# Patient Record
Sex: Female | Born: 1975
Health system: Southern US, Community
[De-identification: ages and names within clinical notes are randomized; demographics above are authoritative.]

## PROBLEM LIST (undated history)

## (undated) DIAGNOSIS — R011 Cardiac murmur, unspecified: Secondary | ICD-10-CM

## (undated) DIAGNOSIS — K219 Gastro-esophageal reflux disease without esophagitis: Secondary | ICD-10-CM

## (undated) DIAGNOSIS — I10 Essential (primary) hypertension: Secondary | ICD-10-CM

## (undated) DIAGNOSIS — N939 Abnormal uterine and vaginal bleeding, unspecified: Secondary | ICD-10-CM

## (undated) DIAGNOSIS — Z973 Presence of spectacles and contact lenses: Secondary | ICD-10-CM

## (undated) DIAGNOSIS — F329 Major depressive disorder, single episode, unspecified: Secondary | ICD-10-CM

## (undated) DIAGNOSIS — F32A Depression, unspecified: Secondary | ICD-10-CM

## (undated) DIAGNOSIS — E119 Type 2 diabetes mellitus without complications: Secondary | ICD-10-CM

## (undated) DIAGNOSIS — E669 Obesity, unspecified: Secondary | ICD-10-CM

## (undated) DIAGNOSIS — R002 Palpitations: Secondary | ICD-10-CM

## (undated) DIAGNOSIS — F419 Anxiety disorder, unspecified: Secondary | ICD-10-CM

## (undated) HISTORY — DX: Obesity, unspecified: E66.9

## (undated) HISTORY — DX: Gastro-esophageal reflux disease without esophagitis: K21.9

## (undated) HISTORY — PX: TUBAL LIGATION: SHX77

## (undated) HISTORY — PX: ABDOMINAL HYSTERECTOMY: SHX81

---

## 2014-04-07 LAB — HM PAP SMEAR

## 2014-12-20 ENCOUNTER — Encounter (HOSPITAL_COMMUNITY): Payer: Self-pay | Admitting: Emergency Medicine

## 2014-12-20 ENCOUNTER — Emergency Department (INDEPENDENT_AMBULATORY_CARE_PROVIDER_SITE_OTHER)
Admission: EM | Admit: 2014-12-20 | Discharge: 2014-12-20 | Disposition: A | Discharge status: Home / Self Care | Payer: Managed Care, Other (non HMO) | Source: Home / Self Care | Attending: Family Medicine | Admitting: Family Medicine

## 2014-12-20 DIAGNOSIS — K2100 Gastro-esophageal reflux disease with esophagitis, without bleeding: Secondary | ICD-10-CM

## 2014-12-20 HISTORY — DX: Essential (primary) hypertension: I10

## 2014-12-20 HISTORY — DX: Major depressive disorder, single episode, unspecified: F32.9

## 2014-12-20 HISTORY — DX: Anxiety disorder, unspecified: F41.9

## 2014-12-20 HISTORY — DX: Depression, unspecified: F32.A

## 2014-12-20 MED ORDER — GI COCKTAIL ~~LOC~~
30.0000 mL | Freq: Once | ORAL | Status: AC
  Administered 2014-12-20: 30 mL via ORAL

## 2014-12-20 MED ORDER — GI COCKTAIL ~~LOC~~
ORAL | Status: AC
  Filled 2014-12-20: qty 30

## 2014-12-20 MED ORDER — SUCRALFATE 1 GM/10ML PO SUSP: 1.0000 g | Freq: Three times a day (TID) | ORAL | Status: DC

## 2014-12-20 NOTE — ED Provider Notes (Signed)
CSN: [id number]     Arrival date & time 12/20/14  1833 History   First MD Initiated Contact with Patient 12/20/14 1858     Chief Complaint  Patient presents with  . Gastrophageal Reflux   (Consider location/radiation/quality/duration/timing/severity/associated sxs/prior Treatment) Patient is a 39 y.o. female presenting with GERD. The history is provided by the patient.  Gastrophageal Reflux This is a chronic problem. The current episode started more than 1 week ago (has to drive 1 hr qam to work, here to [employer]  for family issues, stressed.). The problem occurs daily. The problem has been gradually worsening. Associated symptoms include chest pain and abdominal pain.    Past Medical History  Diagnosis Date  . Anxiety   . Hypertension   . Depression    Past Surgical History  Procedure Laterality Date  . Cesarean section     No family history on file. Social History  Substance Use Topics  . Smoking status: Never Smoker   . Smokeless tobacco: None  . Alcohol Use: Yes   OB History    No data available     Review of Systems  Respiratory: Negative.   Cardiovascular: Positive for chest pain.  Gastrointestinal: Positive for abdominal pain. Negative for nausea, vomiting, diarrhea and abdominal distention.  Genitourinary: Negative.     Allergies  Review of patient's allergies indicates no known allergies.  Home Medications   Prior to Admission medications   Medication Sig Start Date End Date Taking? Authorizing Provider  amLODipine (NORVASC) 2.5 MG tablet Take 2.5 mg by mouth daily.   Yes Historical Provider, MD  BIOTIN PO Take by mouth.   Yes Historical Provider, MD  Cholecalciferol (VITAMIN D3) 3000 UNITS TABS Take by mouth.   Yes Historical Provider, MD  CINNAMON PO Take by mouth.   Yes Historical Provider, MD  escitalopram (LEXAPRO) 10 MG tablet Take 10 mg by mouth daily.   Yes Historical Provider, MD  esomeprazole (NEXIUM) 20 MG capsule Take 20 mg by mouth daily at 12  noon.   Yes Historical Provider, MD  Fish Oil OIL by Does not apply route.   Yes Historical Provider, MD  Magnesium Oxide 400 MG CAPS Take by mouth.   Yes Historical Provider, MD  metoprolol tartrate (LOPRESSOR) 25 MG tablet Take 25 mg by mouth 2 (two) times daily.   Yes Historical Provider, MD  sucralfate (CARAFATE) 1 GM/10ML suspension Take 10 mLs (1 g total) by mouth 4 (four) times daily -  with meals and at bedtime. 12/20/14   Billy Fischer, MD   Meds Ordered and Administered this Visit   Medications  gi cocktail (Maalox,Lidocaine,Donnatal) (30 mLs Oral Given 12/20/14 1938)    BP 134/78 mmHg  Pulse 87  Temp(Src) 97.6 F (36.4 C) (Oral)  Resp 17  SpO2 100%  LMP 12/03/2014 No data found.   Physical Exam  Constitutional: She is oriented to person, place, and time. She appears well-developed and well-nourished. No distress.  Cardiovascular: Regular rhythm, normal heart sounds and intact distal pulses.   Pulmonary/Chest: Effort normal and breath sounds normal.  Abdominal: Soft. Bowel sounds are normal. She exhibits no distension and no mass. There is no tenderness. There is no rebound and no guarding.  Neurological: She is alert and oriented to person, place, and time.  Skin: Skin is warm and dry.  Nursing note and vitals reviewed.   ED Course  Procedures (including critical care time)  Labs Review Labs Reviewed - No data to display  Imaging Review  No results found.   Visual Acuity Review  Right Eye Distance:   Left Eye Distance:   Bilateral Distance:    Right Eye Near:   Left Eye Near:    Bilateral Near:         MDM   1. Gastroesophageal reflux disease with esophagitis        Billy Fischer, MD 12/20/14 [phone]

## 2014-12-20 NOTE — ED Notes (Signed)
Patient new to the area and has no pcp.  Has history of reflux.  Abdominal upset after eating sweets and cold drinks, pain into left mid back.  Increase in belching .

## 2014-12-20 NOTE — Discharge Instructions (Signed)
Use medicine as prescribed and call to see dr stark when appt avail for further stomach care.

## 2014-12-21 ENCOUNTER — Encounter: Payer: Self-pay | Admitting: Gastroenterology

## 2015-02-05 ENCOUNTER — Ambulatory Visit (INDEPENDENT_AMBULATORY_CARE_PROVIDER_SITE_OTHER): Payer: 59 | Admitting: Gastroenterology

## 2015-02-05 ENCOUNTER — Encounter: Payer: Self-pay | Admitting: Gastroenterology

## 2015-02-05 VITALS — BP 136/90 | HR 80 | Ht 69.0 in | Wt 348.5 lb

## 2015-02-05 DIAGNOSIS — M546 Pain in thoracic spine: Secondary | ICD-10-CM | POA: Diagnosis not present

## 2015-02-05 DIAGNOSIS — K219 Gastro-esophageal reflux disease without esophagitis: Secondary | ICD-10-CM | POA: Diagnosis not present

## 2015-02-05 NOTE — Progress Notes (Signed)
    History of Present Illness: This is a 39 year old female referred by Ihor Gully, MD for the evaluation of GERD and back pain. She relates a 6-7 year history of GERD. She notes about a 100 pound weight gain over the past 5 years. Her weight has recently leveled off. Her reflux symptoms have been particularly bothersome for the past few years. Initially treated with Prilosec OTC and now treated with Nexium OTC. She notes frequent breakthrough reflux symptoms and occasional nocturnal symptoms. Over the past 2 months she has had mild pain in her thoracic back just left of midline when swallowing that she describes the pain as 1 out of 10. It starts within a few seconds of swallowing and passes within several minutes. She notes Carafate and Nexium at 40 mg daily have not helped her back pain but her heartburn is better controlled. She noted relief of the symptoms with a GI cocktail when seen at urgent care. Denies weight loss, abdominal pain, constipation, diarrhea, change in stool caliber, melena, hematochezia, nausea, vomiting, dysphagia, chest pain.   Review of Systems: Pertinent positive and negative review of systems were noted in the above HPI section. All other review of systems were otherwise negative.  Current Medications, Allergies, Past Medical History, Past Surgical History, Family History and Social History were reviewed in [employer] record.  Physical Exam: General: Well developed, well nourished, obese, no acute distress Head: Normocephalic and atraumatic Eyes:  sclerae anicteric, EOMI Ears: Normal auditory acuity Mouth: No deformity or lesions Neck: Supple, no masses or thyromegaly Lungs: Clear throughout to auscultation Heart: Regular rate and rhythm; no murmurs, rubs or bruits Abdomen: Soft, non tender and non distended. No masses, hepatosplenomegaly or hernias noted. Normal Bowel sounds Musculoskeletal: Symmetrical with no gross deformities  Skin: No  lesions on visible extremities Pulses:  Normal pulses noted Extremities: No clubbing, cyanosis, edema or deformities noted Neurological: Alert oriented x 4, grossly nonfocal Cervical Nodes:  No significant cervical adenopathy Inguinal Nodes: No significant inguinal adenopathy Psychological:  Alert and cooperative. Normal mood and affect  Assessment and Recommendations:  1. GERD. Back pain related to swallowing. Symptoms suggest an esophageal etiology. Continue Nexium 40 mg daily, Carafate as needed. TUMS or Maalox as needed. Rule out esophagitis and other disorders. Schedule EGD. The risks (including bleeding, perforation, infection, missed lesions, medication reactions and possible hospitalization or surgery if complications occur), benefits, and alternatives to endoscopy with possible biopsy and possible dilation were discussed with the patient and they consent to proceed.    cc: Ihor Gully, MD

## 2015-02-05 NOTE — Patient Instructions (Signed)
You have been scheduled for an endoscopy. Please follow written instructions given to you at your visit today. If you use inhalers (even only as needed), please bring them with you on the day of your procedure.  Patient advised to avoid spicy, acidic, citrus, chocolate, mints, fruit and fruit juices.  Limit the intake of caffeine, alcohol and Soda.  Don't exercise too soon after eating.  Don't lie down within 3-4 hours of eating.  Elevate the head of your bed.  Thank you for choosing me and [facility].  Pricilla Riffle. Dagoberto Ligas., MD., Marval Regal

## 2015-02-08 ENCOUNTER — Telehealth: Payer: Self-pay

## 2015-02-08 ENCOUNTER — Other Ambulatory Visit: Payer: Self-pay

## 2015-02-08 DIAGNOSIS — K219 Gastro-esophageal reflux disease without esophagitis: Secondary | ICD-10-CM

## 2015-02-08 NOTE — Telephone Encounter (Signed)
Error

## 2015-02-08 NOTE — Telephone Encounter (Signed)
Rescheduled patient's EGD for 03/14/15 at 8:30am. Pt notified and verbalized understanding.

## 2015-02-08 NOTE — Telephone Encounter (Signed)
-----   Message from Ladene Artist, MD sent at 02/08/2015 11:23 AM EDT ----- Moderate in the morning is better for me  ----- Message -----    From: Marzella Schlein, CMA    Sent: 02/08/2015  11:05 AM      To: Ladene Artist, MD  So I rescheduled her for your hospital week on 12/7. You Tuesday on 11/29 is full. The only time they could get was 1:30pm if you want propofol. We can do moderate in the morning. Is the 1:30pm ok? ----- Message -----    From: Ladene Artist, MD    Sent: 02/08/2015   8:43 AM      To: Marzella Schlein, CMA  [facility] notified me that her BMI is 51 so cannot perform EGD in [place]

## 2015-02-12 ENCOUNTER — Encounter: Payer: 59 | Admitting: Gastroenterology

## 2015-02-15 ENCOUNTER — Ambulatory Visit: Payer: Managed Care, Other (non HMO) | Admitting: Gastroenterology

## 2015-03-09 ENCOUNTER — Encounter: Payer: Self-pay | Admitting: Internal Medicine

## 2015-03-09 ENCOUNTER — Ambulatory Visit (INDEPENDENT_AMBULATORY_CARE_PROVIDER_SITE_OTHER): Payer: 59 | Admitting: Internal Medicine

## 2015-03-09 VITALS — BP 118/70 | HR 71 | Temp 98.5°F | Resp 20 | Ht 70.0 in | Wt 350.5 lb

## 2015-03-09 DIAGNOSIS — E559 Vitamin D deficiency, unspecified: Secondary | ICD-10-CM

## 2015-03-09 DIAGNOSIS — K219 Gastro-esophageal reflux disease without esophagitis: Secondary | ICD-10-CM | POA: Insufficient documentation

## 2015-03-09 DIAGNOSIS — E669 Obesity, unspecified: Secondary | ICD-10-CM | POA: Insufficient documentation

## 2015-03-09 DIAGNOSIS — I10 Essential (primary) hypertension: Secondary | ICD-10-CM | POA: Diagnosis not present

## 2015-03-09 DIAGNOSIS — E1159 Type 2 diabetes mellitus with other circulatory complications: Secondary | ICD-10-CM | POA: Insufficient documentation

## 2015-03-09 DIAGNOSIS — F419 Anxiety disorder, unspecified: Secondary | ICD-10-CM | POA: Diagnosis not present

## 2015-03-09 MED ORDER — AMLODIPINE BESYLATE 2.5 MG PO TABS: 2.5000 mg | ORAL_TABLET | Freq: Every day | ORAL | Status: DC

## 2015-03-09 MED ORDER — ESCITALOPRAM OXALATE 10 MG PO TABS: 10.0000 mg | ORAL_TABLET | Freq: Every day | ORAL | Status: DC

## 2015-03-09 MED ORDER — SUCRALFATE 1 GM/10ML PO SUSP: 1.0000 g | Freq: Three times a day (TID) | ORAL | Status: DC

## 2015-03-09 MED ORDER — METOPROLOL TARTRATE 25 MG PO TABS: 25.0000 mg | ORAL_TABLET | Freq: Two times a day (BID) | ORAL | Status: DC

## 2015-03-09 NOTE — Assessment & Plan Note (Signed)
She is interested in weight loss Declined nutrition referral Encouraged regular exercise Discussed the importance of decreasing her portions and decreasing intake overall. Healthy diet and encouraged She plans on joining [employer]

## 2015-03-09 NOTE — Assessment & Plan Note (Signed)
Well-controlled here today Continue current medications Check CMP

## 2015-03-09 NOTE — Assessment & Plan Note (Signed)
Following with GI Recently uncontrolled, but now controlled with addition of Carafate EGD scheduled Continue current medications

## 2015-03-09 NOTE — Assessment & Plan Note (Signed)
Taking vitamin D daily 

## 2015-03-09 NOTE — Progress Notes (Signed)
Subjective:    Patient ID: Debra Le, female    DOB: [birth year], 39 y.o.   MRN: [id number]  HPI She is here to establish with a new pcp.   Hypertension: She is taking her medication daily. She is compliant with a low sodium diet.  She denies chest pain, palpitations, edema, shortness of breath and regular headaches. She is exercising regularly.  She does not monitor her blood pressure at home.    GERD:  She is taking her medication daily as prescribed.  She denies any GERD symptoms and feels her GERD is well controlled. Recently she was experiencing uncontrolled heartburn and saw a gastroenterologist. She was placed on Carafate in addition to her Nexium. Her symptoms are well controlled now, but she will be having an EGD to evaluate further.  Right ear:  She states it feels like something is in it.  She denies any pain. A doctor where she works looked in it and saw a scab and advised using a q-tip with neosporin and it has helped.  It feels like there is wax in there.  No change in hearing or itching.    She is interested in a weight loss or nutrition program.  She started celexa six years ago and gained a lot of weight.    Anxiety: she has anxiety that can cause some depression.  She is now taking lexapro daily and it works well.    Medications and allergies reviewed with patient and updated.  Patient Active Problem List   Diagnosis Date Noted  . Essential hypertension, benign 03/09/2015  . GERD (gastroesophageal reflux disease) 03/09/2015  . Anxiety 03/09/2015  . Vitamin D deficiency 03/09/2015    Current Outpatient Prescriptions on File Prior to Visit  Medication Sig Dispense Refill  . BIOTIN PO Take by mouth.    . Cholecalciferol (VITAMIN D3) 3000 UNITS TABS Take by mouth.    Marland Kitchen CINNAMON PO Take by mouth.    . esomeprazole (NEXIUM) 20 MG capsule Take 40 mg by mouth daily at 12 noon.     . Fish Oil OIL by Does not apply route.    . Magnesium Oxide 400 MG CAPS Take by  mouth.     No current facility-administered medications on file prior to visit.    Past Medical History  Diagnosis Date  . Anxiety   . Hypertension   . Depression   . GERD (gastroesophageal reflux disease)   . Obesity     Past Surgical History  Procedure Laterality Date  . Cesarean section  02/21/2005    with Tubal ligation    Social History   Social History  . Marital Status: Married    Spouse Name: N/A  . Number of Children: 2  . Years of Education: N/A   Occupational History  . RN    Social History Main Topics  . Smoking status: Never Smoker   . Smokeless tobacco: Never Used  . Alcohol Use: 4.2 oz/week    7 Standard drinks or equivalent per week     Comment: red wine at night  . Drug Use: No  . Sexual Activity: Not Asked   Other Topics Concern  . None   Social History Narrative    Review of Systems  Constitutional: Negative for fever, chills and fatigue.  HENT: Positive for congestion (occasional). Negative for ear pain, sinus pressure and sore throat.   Eyes: Negative for visual disturbance.  Respiratory: Negative for cough, shortness of breath and wheezing.  Cardiovascular: Negative for chest pain, palpitations and leg swelling.  Gastrointestinal: Negative for nausea, abdominal pain, diarrhea, constipation and blood in stool.  Endocrine: Negative for polydipsia and polyuria.  Genitourinary: Negative for dysuria and hematuria.  Musculoskeletal: Negative for myalgias, back pain and arthralgias.  Neurological: Positive for headaches (menstrual). Negative for dizziness, weakness, light-headedness and numbness.  Psychiatric/Behavioral: Positive for dysphoric mood. The patient is nervous/anxious.        Objective:   Filed Vitals:   03/09/15 1321  BP: 118/70  Pulse: 71  Temp: 98.5 F (36.9 C)  Resp: 20   Filed Weights   03/09/15 1321  Weight: 350 lb 8 oz (158.986 kg)   Body mass index is 50.29 kg/(m^2).   Physical Exam  Constitutional: She  appears well-developed and well-nourished.  HENT:  Head: Normocephalic and atraumatic.  Right Ear: External ear normal.  Left Ear: External ear normal.  Normal ear canals and TM  Eyes: Conjunctivae are normal.  Neck: Neck supple. No tracheal deviation present. No thyromegaly present.  Cardiovascular: Normal rate, regular rhythm and normal heart sounds.   No murmur heard. Pulmonary/Chest: Effort normal. No respiratory distress. She has no wheezes. She has no rales.  Abdominal: Soft. She exhibits no distension. There is no tenderness.  Musculoskeletal: She exhibits no edema.  Lymphadenopathy:    She has no cervical adenopathy.  Skin: Skin is warm and dry.  Psychiatric: She has a normal mood and affect. Her behavior is normal.          Assessment & Plan:   See Problem List.   Blood work ordered  Follow up in 6 months

## 2015-03-09 NOTE — Progress Notes (Signed)
Pre visit review using our clinic review tool, if applicable. No additional management support is needed unless otherwise documented below in the visit note. 

## 2015-03-09 NOTE — Assessment & Plan Note (Signed)
Controlled, stable Continue Lexapro 10 mg daily

## 2015-03-09 NOTE — Patient Instructions (Addendum)
  We have reviewed your prior records including labs and tests today.  Test(s) ordered today. Your results will be released to [place] (or called to you) after review, usually within 72hours after test completion. If any changes need to be made, you will be notified at that same time.  All other Health Maintenance issues reviewed.   All recommended immunizations and age-appropriate screenings are up-to-date.  No immunizations administered today.   Medications reviewed and updated.  No changes recommended at this time.  Your prescription(s) have been submitted to your pharmacy. Please take as directed and contact our office if you believe you are having problem(s) with the medication(s).  Please schedule followup in 6 months  [place], [place] [address] from [facility] [place], [place]

## 2015-03-14 ENCOUNTER — Ambulatory Visit (HOSPITAL_COMMUNITY): Admission: RE | Admit: 2015-03-14 | Payer: 59 | Source: Ambulatory Visit | Admitting: Gastroenterology

## 2015-03-14 ENCOUNTER — Telehealth: Payer: Self-pay | Admitting: Gastroenterology

## 2015-03-14 ENCOUNTER — Encounter (HOSPITAL_COMMUNITY): Admission: RE | Payer: Self-pay | Source: Ambulatory Visit

## 2015-03-14 SURGERY — ESOPHAGOGASTRODUODENOSCOPY (EGD) WITH PROPOFOL
Anesthesia: Monitor Anesthesia Care

## 2015-03-14 NOTE — Telephone Encounter (Signed)
I spoke with Linna Hoff and Sharee Pimple at [facility] endo. They do not know why the case was cancelled. Sharee Pimple cancelled it on 11/29 "scheduling error", but she doesn't know why.  I spoke with the patient she will look at her work schedule and call me back.  She will look about the week of 04/30/15

## 2015-04-25 ENCOUNTER — Encounter: Payer: Self-pay | Admitting: Internal Medicine

## 2015-04-25 ENCOUNTER — Ambulatory Visit (INDEPENDENT_AMBULATORY_CARE_PROVIDER_SITE_OTHER): Payer: 59 | Admitting: Internal Medicine

## 2015-04-25 VITALS — BP 148/92 | HR 86 | Temp 98.4°F | Resp 18 | Wt 352.0 lb

## 2015-04-25 DIAGNOSIS — J069 Acute upper respiratory infection, unspecified: Secondary | ICD-10-CM

## 2015-04-25 MED ORDER — HYDROCOD POLST-CPM POLST ER 10-8 MG/5ML PO SUER: 5.0000 mL | Freq: Two times a day (BID) | ORAL | Status: DC | PRN

## 2015-04-25 NOTE — Progress Notes (Signed)
Subjective:    Patient ID: Debra Le, female    DOB: 1975/08/19, 40 y.o.   MRN: [id number]  HPI She is here for an acute visit for cold symptoms.  Her symptoms started three nights ago.  She has nasal congestion, sore throat, ear pain, sneezing, cough, headaches and chills. She denies any sick contacts. She is taking Alka-Seltzer cold, severe cold and sinus medication, Mucinex and Vicks.      Medications and allergies reviewed with patient and updated if appropriate.  Patient Active Problem List   Diagnosis Date Noted  . Essential hypertension, benign 03/09/2015  . GERD (gastroesophageal reflux disease) 03/09/2015  . Anxiety 03/09/2015  . Vitamin D deficiency 03/09/2015  . Obesity 03/09/2015    Current Outpatient Prescriptions on File Prior to Visit  Medication Sig Dispense Refill  . amLODipine (NORVASC) 2.5 MG tablet Take 1 tablet (2.5 mg total) by mouth daily. 30 tablet 5  . BIOTIN PO Take by mouth.    . Cholecalciferol (VITAMIN D3) 3000 UNITS TABS Take by mouth.    Marland Kitchen CINNAMON PO Take by mouth.    . escitalopram (LEXAPRO) 10 MG tablet Take 1 tablet (10 mg total) by mouth daily. 30 tablet 5  . esomeprazole (NEXIUM) 20 MG capsule Take 40 mg by mouth daily at 12 noon.     . Fish Oil OIL by Does not apply route.    . Magnesium Oxide 400 MG CAPS Take by mouth.    . metoprolol tartrate (LOPRESSOR) 25 MG tablet Take 1 tablet (25 mg total) by mouth 2 (two) times daily. 60 tablet 5  . sucralfate (CARAFATE) 1 GM/10ML suspension Take 10 mLs (1 g total) by mouth 4 (four) times daily -  with meals and at bedtime. 420 mL 3   No current facility-administered medications on file prior to visit.    Past Medical History  Diagnosis Date  . Anxiety   . Hypertension   . Depression   . GERD (gastroesophageal reflux disease)   . Obesity     Past Surgical History  Procedure Laterality Date  . Cesarean section  02/21/2005    with Tubal ligation    Social History   Social  History  . Marital Status: Married    Spouse Name: N/A  . Number of Children: 2  . Years of Education: N/A   Occupational History  . RN    Social History Main Topics  . Smoking status: Never Smoker   . Smokeless tobacco: Never Used  . Alcohol Use: 4.2 oz/week    7 Standard drinks or equivalent per week     Comment: red wine at night  . Drug Use: No  . Sexual Activity: Not Asked   Other Topics Concern  . 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 Diabetes Mother   . Diabetes Maternal Aunt     all maternal side  . Diabetes Sister     x 2  . Hypertension Maternal Aunt     all maternal side  . Lymphoma Father   . Heart attack Maternal Aunt   . Dementia Maternal Aunt     Review of Systems  Constitutional: Positive for chills. Negative for fever.  HENT: Positive for congestion, ear pain (right), sneezing and sore throat. Negative for sinus pressure.   Respiratory: Positive for cough (dry). Negative for shortness of  breath and wheezing.   Cardiovascular: Negative for chest pain.  Gastrointestinal: Negative for nausea, abdominal pain and diarrhea.  Musculoskeletal:       Mild body aches  Neurological: Positive for headaches. Negative for dizziness and light-headedness.       Objective:   Filed Vitals:   04/25/15 1000  BP: 148/92  Pulse: 86  Temp: 98.4 F (36.9 C)  Resp: 18   Filed Weights   04/25/15 1000  Weight: 352 lb (159.666 kg)   Body mass index is 50.51 kg/(m^2).   Physical Exam GENERAL APPEARANCE: Appears stated age, mildly ill appearing, NAD EYES: conjunctiva clear, no icterus HEENT: bilateral tympanic membranes and ear canals normal, oropharynx with mild erythema, no thyromegaly, trachea midline, no cervical or supraclavicular lymphadenopathy LUNGS: Clear to auscultation without wheeze or crackles, unlabored breathing, good air entry bilaterally HEART: Normal S1,S2  without murmurs EXTREMITIES: Without clubbing, cyanosis, or edema        Assessment & Plan:   URI Viral in nature No need for an antibiotic Will prescribe cough syrup with hydrocodone Discussed over-the-counter cold medications for symptom relief Rest and fluids Call if no improvement  Note given to excuse her from work

## 2015-04-25 NOTE — Patient Instructions (Addendum)
A prescription for cough syrup was given.  Start flonase.    If your symptoms worsen or fail to improve, please contact our office for further instruction, or in case of emergency go directly to the emergency room at the closest medical facility.   General Recommendations:    Please drink plenty of fluids.  Get plenty of rest   Sleep in humidified air  Use saline nasal sprays  Netti pot  OTC Medications:  Decongestants - helps relieve congestion   Flonase (generic fluticasone) or Nasacort (generic triamcinolone) - please make sure to use the "cross-over" technique at a 45 degree angle towards the opposite eye as opposed to straight up the nasal passageway.   Sudafed (generic pseudoephedrine - Note this is the one that is available behind the pharmacy counter); Products with phenylephrine (-PE) may also be used but is often not as effective as pseudoephedrine.   If you have HIGH BLOOD PRESSURE - Coricidin HBP; AVOID any product that is -D as this contains pseudoephedrine which may increase your blood pressure.  Afrin (oxymetazoline) every 6-8 hours for up to 3 days.  Allergies - helps relieve runny nose, itchy eyes and sneezing   Claritin (generic loratidine), Allegra (fexofenidine), or Zyrtec (generic cyrterizine) for runny nose. These medications should not cause drowsiness.  Note - Benadryl (generic diphenhydramine) may be used however may cause drowsiness  Cough -   Delsym or Robitussin (generic dextromethorphan)  Expectorants - helps loosen mucus to ease removal   Mucinex (generic guaifenesin) as directed on the package.  Headaches / General Aches   Tylenol (generic acetaminophen) - DO NOT EXCEED 3 grams (3,000 mg) in a 24 hour time period  Advil/Motrin (generic ibuprofen)  Sore Throat -   Salt water gargle   Chloraseptic (generic benzocaine) spray or lozenges / Sucrets (generic dyclonine)

## 2015-04-25 NOTE — Progress Notes (Signed)
Pre visit review using our clinic review tool, if applicable. No additional management support is needed unless otherwise documented below in the visit note. 

## 2015-07-04 ENCOUNTER — Other Ambulatory Visit: Payer: Self-pay | Admitting: Internal Medicine

## 2015-07-11 ENCOUNTER — Other Ambulatory Visit: Payer: Self-pay | Admitting: *Deleted

## 2015-07-11 MED ORDER — AMLODIPINE BESYLATE 2.5 MG PO TABS: 2.5000 mg | ORAL_TABLET | Freq: Every day | ORAL | Status: DC

## 2015-07-11 MED ORDER — METOPROLOL TARTRATE 25 MG PO TABS: 25.0000 mg | ORAL_TABLET | Freq: Two times a day (BID) | ORAL | Status: DC

## 2015-07-11 MED ORDER — ESCITALOPRAM OXALATE 10 MG PO TABS: 10.0000 mg | ORAL_TABLET | Freq: Every day | ORAL | Status: DC

## 2015-07-11 NOTE — Telephone Encounter (Signed)
Received call pt states she is needing refills on her medications. Not schedule to see md until June. Verified which med she is needing & pharmacy inform will send to [facility].../lmb

## 2015-08-27 ENCOUNTER — Telehealth: Payer: Self-pay | Admitting: Internal Medicine

## 2015-08-27 NOTE — Telephone Encounter (Signed)
Error

## 2015-09-11 ENCOUNTER — Ambulatory Visit: Payer: 59 | Admitting: Internal Medicine

## 2015-09-19 ENCOUNTER — Encounter: Payer: Self-pay | Admitting: Internal Medicine

## 2015-09-19 ENCOUNTER — Ambulatory Visit (INDEPENDENT_AMBULATORY_CARE_PROVIDER_SITE_OTHER): Payer: 59 | Admitting: Internal Medicine

## 2015-09-19 ENCOUNTER — Other Ambulatory Visit (INDEPENDENT_AMBULATORY_CARE_PROVIDER_SITE_OTHER): Payer: 59

## 2015-09-19 VITALS — BP 130/86 | HR 69 | Temp 98.7°F | Resp 16 | Ht 70.0 in | Wt 351.0 lb

## 2015-09-19 DIAGNOSIS — E669 Obesity, unspecified: Secondary | ICD-10-CM | POA: Diagnosis not present

## 2015-09-19 DIAGNOSIS — K219 Gastro-esophageal reflux disease without esophagitis: Secondary | ICD-10-CM

## 2015-09-19 DIAGNOSIS — E559 Vitamin D deficiency, unspecified: Secondary | ICD-10-CM | POA: Diagnosis not present

## 2015-09-19 DIAGNOSIS — R229 Localized swelling, mass and lump, unspecified: Secondary | ICD-10-CM

## 2015-09-19 DIAGNOSIS — L910 Hypertrophic scar: Secondary | ICD-10-CM | POA: Insufficient documentation

## 2015-09-19 DIAGNOSIS — I10 Essential (primary) hypertension: Secondary | ICD-10-CM

## 2015-09-19 DIAGNOSIS — F419 Anxiety disorder, unspecified: Secondary | ICD-10-CM

## 2015-09-19 DIAGNOSIS — G43809 Other migraine, not intractable, without status migrainosus: Secondary | ICD-10-CM

## 2015-09-19 DIAGNOSIS — G43909 Migraine, unspecified, not intractable, without status migrainosus: Secondary | ICD-10-CM | POA: Insufficient documentation

## 2015-09-19 LAB — COMPREHENSIVE METABOLIC PANEL
ALBUMIN: 3.8 g/dL (ref 3.5–5.2)
ALK PHOS: 104 U/L (ref 39–117)
ALT: 15 U/L (ref 0–35)
AST: 13 U/L (ref 0–37)
BUN: 11 mg/dL (ref 6–23)
CO2: 29 mEq/L (ref 19–32)
Calcium: 8.8 mg/dL (ref 8.4–10.5)
Chloride: 104 mEq/L (ref 96–112)
Creatinine, Ser: 0.82 mg/dL (ref 0.40–1.20)
GFR: 99.58 mL/min (ref >60.00)
Glucose, Bld: 124 mg/dL — ABNORMAL HIGH (ref 70–99)
POTASSIUM: 4.2 meq/L (ref 3.5–5.1)
SODIUM: 138 meq/L (ref 135–145)
TOTAL PROTEIN: 7.5 g/dL (ref 6.0–8.3)
Total Bilirubin: 0.3 mg/dL (ref 0.2–1.2)

## 2015-09-19 LAB — CBC WITH DIFFERENTIAL/PLATELET
Basophils Absolute: 0 10*3/uL (ref 0.0–0.1)
Basophils Relative: 0.5 % (ref 0.0–3.0)
EOS PCT: 0.8 % (ref 0.0–5.0)
Eosinophils Absolute: 0.1 10*3/uL (ref 0.0–0.7)
HCT: 33.7 % — ABNORMAL LOW (ref 36.0–46.0)
HEMOGLOBIN: 11 g/dL — AB (ref 12.0–15.0)
LYMPHS PCT: 46.9 % — AB (ref 12.0–46.0)
Lymphs Abs: 3.3 10*3/uL (ref 0.7–4.0)
MCHC: 32.6 g/dL (ref 30.0–36.0)
MCV: 84.4 fl (ref 78.0–100.0)
MONOS PCT: 5 % (ref 3.0–12.0)
Monocytes Absolute: 0.4 10*3/uL (ref 0.1–1.0)
Neutro Abs: 3.3 10*3/uL (ref 1.4–7.7)
Neutrophils Relative %: 46.8 % (ref 43.0–77.0)
Platelets: 361 10*3/uL (ref 150.0–400.0)
RBC: 4 Mil/uL (ref 3.87–5.11)
RDW: 15 % (ref 11.5–15.5)
WBC: 7.1 10*3/uL (ref 4.0–10.5)

## 2015-09-19 LAB — LIPID PANEL
Cholesterol: 182 mg/dL (ref 0–200)
HDL: 27.3 mg/dL — AB (ref >39.00)
LDL Cholesterol: 130 mg/dL — ABNORMAL HIGH (ref 0–99)
NONHDL: 155.19
Total CHOL/HDL Ratio: 7
Triglycerides: 128 mg/dL (ref 0.0–149.0)
VLDL: 25.6 mg/dL (ref 0.0–40.0)

## 2015-09-19 LAB — MAGNESIUM: Magnesium: 2 mg/dL (ref 1.5–2.5)

## 2015-09-19 LAB — TSH: TSH: 1.81 u[IU]/mL (ref 0.35–4.50)

## 2015-09-19 LAB — HEMOGLOBIN A1C: HEMOGLOBIN A1C: 7.4 % — AB (ref 4.6–6.5)

## 2015-09-19 LAB — VITAMIN B12: VITAMIN B 12: 647 pg/mL (ref 211–911)

## 2015-09-19 MED ORDER — TRAMADOL HCL 50 MG PO TABS: 50.0000 mg | ORAL_TABLET | Freq: Three times a day (TID) | ORAL | Status: DC | PRN

## 2015-09-19 MED ORDER — SUMATRIPTAN SUCCINATE 100 MG PO TABS: 100.0000 mg | ORAL_TABLET | ORAL | Status: DC | PRN

## 2015-09-19 NOTE — Assessment & Plan Note (Signed)
In abdomen along c-section scar Tender for a few months, severe pain with her menses this past month Does not feel like a cyst or lipoma Possible hernia Will check an [place] She will schedule with her gyn Tramadol as needed for pain

## 2015-09-19 NOTE — Progress Notes (Signed)
Subjective:    Patient ID: Debra Le, female    DOB: 1975-08-28, 40 y.o.   MRN: [id number]  HPI She is here for follow up.  Hypertension: She is taking her medication daily. She is compliant with a low sodium diet.  She denies chest pain, palpitations, edema, shortness of breath and regular headaches. She is not exercising regularly.  She does not monitor her blood pressure at home.    GERD:  She is taking her medication daily as prescribed.  She denies any GERD symptoms and feels her GERD is well controlled.   Anxiety: She is taking her medication daily as prescribed. She denies any side effects from the medication. She feels her anxiety is well controlled and she is happy with her current dose of medication.   Headaches:  For the past two years when she gets her period she gets a pounding headache when she is sleeping.  They used to last one night and then go away.  This month it lasted about 4 days.  She has tried midol and advil and they haven't worked in the past, but this past time did not seem to help.  This past time she did have some photosensitivity and nausea.  She wonders if there is something else she can take.  C-section about 10 years ago.  She has had a little lump near the scar for a while.  The lump is getting bigger.  It has ached three for a few months.  This past month it has been more painful when she had her period only. The pain was severe.  She has taken aleve, motrin and has used a heating pad.  She had to take a norco it hurt so bad.  It still hurts.  Today it is better.  She last saw her gyn about one year ago.    She had a piercing in her right ear and developed a large scar over. She would like to see someone to have it removed.  Medications and allergies reviewed with patient and updated if appropriate.  Patient Active Problem List   Diagnosis Date Noted  . Essential hypertension, benign 03/09/2015  . GERD (gastroesophageal reflux disease) 03/09/2015  .  Anxiety 03/09/2015  . Vitamin D deficiency 03/09/2015  . Obesity 03/09/2015    Current Outpatient Prescriptions on File Prior to Visit  Medication Sig Dispense Refill  . amLODipine (NORVASC) 2.5 MG tablet Take 1 tablet (2.5 mg total) by mouth daily. Keep June appt for future refills 90 tablet 0  . BIOTIN PO Take by mouth.    . Cholecalciferol (VITAMIN D3) 3000 UNITS TABS Take by mouth.    Marland Kitchen CINNAMON PO Take by mouth.    . escitalopram (LEXAPRO) 10 MG tablet Take 1 tablet (10 mg total) by mouth daily. Keep June appt for future refills 90 tablet 0  . esomeprazole (NEXIUM) 20 MG capsule Take 40 mg by mouth daily at 12 noon.     . Fish Oil OIL by Does not apply route.    . metoprolol tartrate (LOPRESSOR) 25 MG tablet Take 1 tablet (25 mg total) by mouth 2 (two) times daily. 180 tablet 0   No current facility-administered medications on file prior to visit.    Past Medical History  Diagnosis Date  . Anxiety   . Hypertension   . Depression   . GERD (gastroesophageal reflux disease)   . Obesity     Past Surgical History  Procedure Laterality Date  .  Cesarean section  02/21/2005    with Tubal ligation    Social History   Social History  . Marital Status: Married    Spouse Name: N/A  . Number of Children: 2  . Years of Education: N/A   Occupational History  . RN    Social History Main Topics  . Smoking status: Never Smoker   . Smokeless tobacco: Never Used  . Alcohol Use: 4.2 oz/week    7 Standard drinks or equivalent per week     Comment: red wine at night  . Drug Use: No  . Sexual Activity: Not on file   Other Topics Concern  . 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 Diabetes Mother   . Diabetes Maternal Aunt     all maternal side  . Diabetes Sister     x 2  . Hypertension Maternal Aunt     all maternal side  . Lymphoma Father   . Heart attack Maternal Aunt   .  Dementia Maternal Aunt     Review of Systems  Constitutional: Negative for fever.  Respiratory: Negative for cough, shortness of breath and wheezing.   Cardiovascular: Negative for chest pain, palpitations and leg swelling.  Gastrointestinal: Positive for abdominal pain.       No gerd  Neurological: Positive for headaches (migraines). Negative for dizziness and light-headedness.       Objective:   Filed Vitals:   09/19/15 0945  BP: 130/86  Pulse: 69  Temp: 98.7 F (37.1 C)  Resp: 16   Filed Weights   09/19/15 0945  Weight: 351 lb (159.213 kg)   Body mass index is 50.36 kg/(m^2).   Physical Exam Constitutional: Appears well-developed and well-nourished. No distress.  Neck: Neck supple. No tracheal deviation present. No thyromegaly present.  No carotid bruit. No cervical adenopathy.   large keloid scar posterior upper right ear Cardiovascular: Normal rate, regular rhythm and normal heart sounds.   No murmur heard.  No edema Pulmonary/Chest: Effort normal and breath sounds normal. No respiratory distress. No wheezes.  Abdomen: Obese, soft, C-section scar across lower abdomen-in the midsection there is a firm bump that is slightly tender-does not feel like a typical lipoma or cyst, no surrounding erythema, no open wound Psych: normal mood and affect      Assessment & Plan:    See Problem List for Assessment and Plan of chronic medical problems.  Follow-up in 6 months

## 2015-09-19 NOTE — Assessment & Plan Note (Signed)
GERD controlled Continue daily medication  

## 2015-09-19 NOTE — Assessment & Plan Note (Signed)
BP well controlled Current regimen effective and well tolerated Continue current medications at current doses Check labs Work on weight loss

## 2015-09-19 NOTE — Assessment & Plan Note (Signed)
Controlled, stable Continue current dose of medication  

## 2015-09-19 NOTE — Assessment & Plan Note (Signed)
Work on increasing exercise Check a1c

## 2015-09-19 NOTE — Assessment & Plan Note (Signed)
Advil, midol not helping any more Trial of imitrex If the imitrex is not effective she will let me know and we can try something else

## 2015-09-19 NOTE — Progress Notes (Signed)
Pre visit review using our clinic review tool, if applicable. No additional management support is needed unless otherwise documented below in the visit note. 

## 2015-09-19 NOTE — Assessment & Plan Note (Signed)
Right ear from piercing We'll refer to plastic surgery for removal

## 2015-09-19 NOTE — Patient Instructions (Addendum)
[facility] - Dr Zoe Lan [facility] [address] [place], [place] [address] from [place], [place]   Test(s) ordered today. Your results will be released to [facility] (or called to you) after review, usually within 72hours after test completion. If any changes need to be made, you will be notified at that same time.  All other Health Maintenance issues reviewed.   All recommended immunizations and age-appropriate screenings are up-to-date or discussed.  No immunizations administered today.   Medications reviewed and updated.  Changes include a trial of imitrex for your migraines.  If this does not work let me know and we will try something different.   A referral was ordered for plastic surgery.  An [place] of your abdomen was ordered  Please followup in 6 months

## 2015-09-20 ENCOUNTER — Encounter: Payer: Self-pay | Admitting: Internal Medicine

## 2015-09-20 DIAGNOSIS — E1169 Type 2 diabetes mellitus with other specified complication: Secondary | ICD-10-CM | POA: Insufficient documentation

## 2015-09-20 DIAGNOSIS — E119 Type 2 diabetes mellitus without complications: Secondary | ICD-10-CM | POA: Insufficient documentation

## 2015-10-04 ENCOUNTER — Other Ambulatory Visit: Payer: Self-pay | Admitting: Internal Medicine

## 2015-10-04 ENCOUNTER — Ambulatory Visit
Admission: RE | Admit: 2015-10-04 | Discharge: 2015-10-04 | Disposition: A | Discharge status: Home / Self Care | Payer: 59 | Source: Ambulatory Visit | Attending: Internal Medicine | Admitting: Internal Medicine

## 2015-10-04 DIAGNOSIS — R229 Localized swelling, mass and lump, unspecified: Secondary | ICD-10-CM

## 2015-10-15 ENCOUNTER — Other Ambulatory Visit: Payer: Self-pay | Admitting: Internal Medicine

## 2015-11-27 ENCOUNTER — Telehealth: Payer: Self-pay | Admitting: Emergency Medicine

## 2015-11-27 MED ORDER — BUPROPION HCL ER (XL) 150 MG PO TB24: 150.0000 mg | ORAL_TABLET | Freq: Every day | ORAL | 1 refills | Status: DC

## 2015-11-27 NOTE — Telephone Encounter (Signed)
Spoke with pt to inform.  

## 2015-11-27 NOTE — Addendum Note (Signed)
Addended by: Binnie Rail on: 11/27/2015 12:24 PM   Modules accepted: Orders

## 2015-11-27 NOTE — Telephone Encounter (Signed)
Pt states the Lexapro makes her "numb". Would like to if she can still have Wellbutrin 100-150 mg added with the lexapro. Please advise.

## 2015-11-27 NOTE — Telephone Encounter (Signed)
Pt asked for you to call her back about medications. Wouldn't give any other details. Please advise thanks.

## 2015-11-27 NOTE — Telephone Encounter (Signed)
Yes, we can add wellbutrin 150 mg daily to the lexapro.  [facility]butrin [facility].

## 2015-12-28 ENCOUNTER — Encounter: Payer: 59 | Admitting: Internal Medicine

## 2015-12-28 NOTE — Progress Notes (Signed)
Subjective:    Patient ID: Debra Le, female    DOB: 1975-08-02, 40 y.o.   MRN: [id number]  HPI error   Medications and allergies reviewed with patient and updated if appropriate.  Patient Active Problem List   Diagnosis Date Noted  . Diabetes ([place]) 09/20/2015  . Keloid 09/19/2015  . Lump of skin 09/19/2015  . Migraines 09/19/2015  . Essential hypertension, benign 03/09/2015  . GERD (gastroesophageal reflux disease) 03/09/2015  . Anxiety 03/09/2015  . Vitamin D deficiency 03/09/2015  . Obesity 03/09/2015    Current Outpatient Prescriptions on File Prior to Visit  Medication Sig Dispense Refill  . amLODipine (NORVASC) 2.5 MG tablet Take 1 tablet (2.5 mg total) by mouth daily. 90 tablet 2  . BIOTIN PO Take by mouth.    Marland Kitchen buPROPion (WELLBUTRIN XL) 150 MG 24 hr tablet Take 1 tablet (150 mg total) by mouth daily. 90 tablet 1  . Cholecalciferol (VITAMIN D3) 3000 UNITS TABS Take by mouth.    Marland Kitchen CINNAMON PO Take by mouth.    . escitalopram (LEXAPRO) 10 MG tablet Take 1 tablet (10 mg total) by mouth daily. 90 tablet 0  . esomeprazole (NEXIUM) 20 MG capsule Take 40 mg by mouth daily at 12 noon.     . Fish Oil OIL by Does not apply route.    . metoprolol tartrate (LOPRESSOR) 25 MG tablet TAKE 1 TABLET (25 MG TOTAL) BY MOUTH 2 (TWO) TIMES DAILY. 180 tablet 2  . SUMAtriptan (IMITREX) 100 MG tablet Take 1 tablet (100 mg total) by mouth every 2 (two) hours as needed for migraine. May repeat in 2 hours if headache persists or recurs. 10 tablet 5  . traMADol (ULTRAM) 50 MG tablet Take 1 tablet (50 mg total) by mouth every 8 (eight) hours as needed. 30 tablet 0   No current facility-administered medications on file prior to visit.     Past Medical History:  Diagnosis Date  . Anxiety   . Depression   . GERD (gastroesophageal reflux disease)   . Hypertension   . Obesity     Past Surgical History:  Procedure Laterality Date  . CESAREAN SECTION  02/21/2005   with Tubal ligation      Social History   Social History  . Marital status: Married    Spouse name: N/A  . Number of children: 2  . Years of education: N/A   Occupational History  . RN    Social History Main Topics  . Smoking status: Never Smoker  . Smokeless tobacco: Never Used  . Alcohol use 4.2 oz/week    7 Standard drinks or equivalent per week     Comment: red wine at night  . Drug use: No  . Sexual activity: Not on file   Other Topics Concern  . 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 Diabetes Mother   . Diabetes Maternal Aunt     all maternal side  . Diabetes Sister     x 2  . Hypertension Maternal Aunt     all maternal side  . Lymphoma Father   . Heart attack Maternal Aunt   . Dementia Maternal Aunt     Review of Systems     Objective:  There were no vitals filed for this visit. There were no vitals filed for this visit. There  is no height or weight on file to calculate BMI.   Physical Exam         Assessment & Plan:    See Problem List for Assessment and Plan of chronic medical problems.    This encounter was created in error - please disregard.

## 2015-12-28 NOTE — Assessment & Plan Note (Signed)
Resulting in diabetes, htn Stressed lifestyle - regular exercise, decreased portions and weight loss

## 2016-01-27 ENCOUNTER — Other Ambulatory Visit: Payer: Self-pay | Admitting: Internal Medicine

## 2016-03-21 ENCOUNTER — Ambulatory Visit: Payer: 59 | Admitting: Internal Medicine

## 2016-04-16 ENCOUNTER — Other Ambulatory Visit: Payer: Self-pay | Admitting: Internal Medicine

## 2016-04-17 ENCOUNTER — Other Ambulatory Visit: Payer: Self-pay | Admitting: Internal Medicine

## 2016-05-21 ENCOUNTER — Ambulatory Visit: Payer: 59 | Admitting: Internal Medicine

## 2016-06-12 ENCOUNTER — Encounter: Payer: Self-pay | Admitting: Internal Medicine

## 2016-06-13 ENCOUNTER — Ambulatory Visit (INDEPENDENT_AMBULATORY_CARE_PROVIDER_SITE_OTHER): Payer: 59 | Admitting: Internal Medicine

## 2016-06-13 ENCOUNTER — Encounter: Payer: Self-pay | Admitting: Internal Medicine

## 2016-06-13 ENCOUNTER — Other Ambulatory Visit (INDEPENDENT_AMBULATORY_CARE_PROVIDER_SITE_OTHER): Payer: 59

## 2016-06-13 VITALS — BP 130/94 | HR 71 | Temp 98.0°F | Resp 16 | Ht 70.0 in | Wt 345.0 lb

## 2016-06-13 DIAGNOSIS — N809 Endometriosis, unspecified: Secondary | ICD-10-CM | POA: Insufficient documentation

## 2016-06-13 DIAGNOSIS — F329 Major depressive disorder, single episode, unspecified: Secondary | ICD-10-CM | POA: Insufficient documentation

## 2016-06-13 DIAGNOSIS — K219 Gastro-esophageal reflux disease without esophagitis: Secondary | ICD-10-CM | POA: Diagnosis not present

## 2016-06-13 DIAGNOSIS — I10 Essential (primary) hypertension: Secondary | ICD-10-CM | POA: Diagnosis not present

## 2016-06-13 DIAGNOSIS — E119 Type 2 diabetes mellitus without complications: Secondary | ICD-10-CM

## 2016-06-13 DIAGNOSIS — F419 Anxiety disorder, unspecified: Secondary | ICD-10-CM

## 2016-06-13 DIAGNOSIS — G43809 Other migraine, not intractable, without status migrainosus: Secondary | ICD-10-CM | POA: Diagnosis not present

## 2016-06-13 DIAGNOSIS — E669 Obesity, unspecified: Secondary | ICD-10-CM | POA: Diagnosis not present

## 2016-06-13 DIAGNOSIS — F32A Depression, unspecified: Secondary | ICD-10-CM

## 2016-06-13 LAB — MICROALBUMIN / CREATININE URINE RATIO
Creatinine,U: 156.8 mg/dL
Microalb Creat Ratio: 1 mg/g (ref 0.0–30.0)
Microalb, Ur: 1.6 mg/dL (ref 0.0–1.9)

## 2016-06-13 LAB — TSH: TSH: 2.09 u[IU]/mL (ref 0.35–4.50)

## 2016-06-13 LAB — CBC WITH DIFFERENTIAL/PLATELET
BASOS PCT: 1.4 % (ref 0.0–3.0)
Basophils Absolute: 0.1 10*3/uL (ref 0.0–0.1)
EOS PCT: 0.8 % (ref 0.0–5.0)
Eosinophils Absolute: 0.1 10*3/uL (ref 0.0–0.7)
HCT: 33.8 % — ABNORMAL LOW (ref 36.0–46.0)
Hemoglobin: 11 g/dL — ABNORMAL LOW (ref 12.0–15.0)
LYMPHS ABS: 3.2 10*3/uL (ref 0.7–4.0)
Lymphocytes Relative: 36.5 % (ref 12.0–46.0)
MCHC: 32.5 g/dL (ref 30.0–36.0)
MCV: 84.5 fl (ref 78.0–100.0)
Monocytes Absolute: 0.3 10*3/uL (ref 0.1–1.0)
Monocytes Relative: 3.9 % (ref 3.0–12.0)
NEUTROS PCT: 57.4 % (ref 43.0–77.0)
Neutro Abs: 5.1 10*3/uL (ref 1.4–7.7)
Platelets: 400 10*3/uL (ref 150.0–400.0)
RBC: 4 Mil/uL (ref 3.87–5.11)
RDW: 15.2 % (ref 11.5–15.5)
WBC: 8.9 10*3/uL (ref 4.0–10.5)

## 2016-06-13 LAB — COMPREHENSIVE METABOLIC PANEL
ALBUMIN: 3.9 g/dL (ref 3.5–5.2)
ALK PHOS: 112 U/L (ref 39–117)
ALT: 20 U/L (ref 0–35)
AST: 17 U/L (ref 0–37)
BILIRUBIN TOTAL: 0.2 mg/dL (ref 0.2–1.2)
BUN: 8 mg/dL (ref 6–23)
CO2: 26 mEq/L (ref 19–32)
CREATININE: 0.85 mg/dL (ref 0.40–1.20)
Calcium: 9.4 mg/dL (ref 8.4–10.5)
Chloride: 104 mEq/L (ref 96–112)
GFR: 95.18 mL/min (ref >60.00)
Glucose, Bld: 159 mg/dL — ABNORMAL HIGH (ref 70–99)
Potassium: 4.1 mEq/L (ref 3.5–5.1)
SODIUM: 136 meq/L (ref 135–145)
TOTAL PROTEIN: 8 g/dL (ref 6.0–8.3)

## 2016-06-13 LAB — HEMOGLOBIN A1C: HEMOGLOBIN A1C: 7.7 % — AB (ref 4.6–6.5)

## 2016-06-13 MED ORDER — AMLODIPINE BESYLATE 5 MG PO TABS: 5.0000 mg | ORAL_TABLET | Freq: Every day | ORAL | 3 refills | Status: DC

## 2016-06-13 MED ORDER — ESOMEPRAZOLE MAGNESIUM 40 MG PO CPDR: 40.0000 mg | DELAYED_RELEASE_CAPSULE | Freq: Every day | ORAL | 3 refills | Status: DC

## 2016-06-13 MED ORDER — METOPROLOL TARTRATE 25 MG PO TABS: ORAL_TABLET | ORAL | 3 refills | Status: DC

## 2016-06-13 MED ORDER — ESCITALOPRAM OXALATE 10 MG PO TABS: 10.0000 mg | ORAL_TABLET | Freq: Every day | ORAL | 1 refills | Status: DC

## 2016-06-13 MED ORDER — BUPROPION HCL ER (XL) 300 MG PO TB24: 300.0000 mg | ORAL_TABLET | Freq: Every day | ORAL | 3 refills | Status: DC

## 2016-06-13 NOTE — Assessment & Plan Note (Addendum)
Elevated here and at gyn's office Increase amlodipine to 5 mg daily Check cmp

## 2016-06-13 NOTE — Patient Instructions (Addendum)
  Test(s) ordered today. Your results will be released to [place] (or called to you) after review, usually within 72hours after test completion. If any changes need to be made, you will be notified at that same time.  All other Health Maintenance issues reviewed.   All recommended immunizations and age-appropriate screenings are up-to-date or discussed.  No immunizations administered today.   Medications reviewed and updated.  Changes include increasing wellbutrin to 300 mg daily and increasing amlodipine to 5 mg daily.   Your prescription(s) have been submitted to your pharmacy. Please take as directed and contact our office if you believe you are having problem(s) with the medication(s).   Please followup in 6 months

## 2016-06-13 NOTE — Assessment & Plan Note (Addendum)
Uncontrolled Discussed option Increase wellbutrin to 300 mg Continue lexapro 10 mg daily

## 2016-06-13 NOTE — Progress Notes (Signed)
Pre visit review using our clinic review tool, if applicable. No additional management support is needed unless otherwise documented below in the visit note. 

## 2016-06-13 NOTE — Progress Notes (Signed)
Subjective:    Patient ID: Debra Le, female    DOB: 10/24/1975, 41 y.o.   MRN: [id number]  HPI The patient is here for follow up.  Hypertension: She is taking her medication daily. She is compliant with a low sodium diet.  She denies chest pain, palpitations, edema, shortness of breath and regular headaches. She is walking regularly.    Diabetes: She is not on any medication for her diabetes. She is compliant with a diabetic diet. She is exercising regularly - walking.  She checks her feet daily and denies foot lesions. She is up-to-date with an ophthalmology examination.   GERD:  She is taking her medication daily as prescribed.  She denies any GERD symptoms and feels her GERD is well controlled.   Anxiety: She is taking her medication daily as prescribed. She denies any side effects from the medication. She feels her anxiety is well controlled.     Depression: She is taking her medication daily as prescribed. She denies any side effects from the medication. She feels her depression is not as well controlled.  She wonders about changing her medication.      Migraines:  She is taking imitrex as needed.     Medications and allergies reviewed with patient and updated if appropriate.  Patient Active Problem List   Diagnosis Date Noted  . Diabetes ([place]) 09/20/2015  . Keloid 09/19/2015  . Lump of skin 09/19/2015  . Migraines 09/19/2015  . Essential hypertension, benign 03/09/2015  . GERD (gastroesophageal reflux disease) 03/09/2015  . Anxiety 03/09/2015  . Vitamin D deficiency 03/09/2015  . Morbid obesity ([place]) 03/09/2015    Current Outpatient Prescriptions on File Prior to Visit  Medication Sig Dispense Refill  . amLODipine (NORVASC) 2.5 MG tablet Take 1 tablet (2.5 mg total) by mouth daily. 90 tablet 2  . BIOTIN PO Take by mouth.    Marland Kitchen buPROPion (WELLBUTRIN XL) 150 MG 24 hr tablet Take 1 tablet (150 mg total) by mouth daily. 90 tablet 1  . Cholecalciferol (VITAMIN D3)  3000 UNITS TABS Take by mouth.    Marland Kitchen CINNAMON PO Take by mouth.    . escitalopram (LEXAPRO) 10 MG tablet TAKE 1 TABLET (10 MG TOTAL) BY MOUTH DAILY. 90 tablet 0  . esomeprazole (NEXIUM) 20 MG capsule Take 40 mg by mouth daily at 12 noon.     . Fish Oil OIL by Does not apply route.    . metoprolol tartrate (LOPRESSOR) 25 MG tablet TAKE 1 TABLET (25 MG TOTAL) BY MOUTH 2 (TWO) TIMES DAILY. 180 tablet 2  . SUMAtriptan (IMITREX) 100 MG tablet Take 1 tablet (100 mg total) by mouth every 2 (two) hours as needed for migraine. May repeat in 2 hours if headache persists or recurs. 10 tablet 5  . traMADol (ULTRAM) 50 MG tablet Take 1 tablet (50 mg total) by mouth every 8 (eight) hours as needed. 30 tablet 0   No current facility-administered medications on file prior to visit.     Past Medical History:  Diagnosis Date  . Anxiety   . Depression   . GERD (gastroesophageal reflux disease)   . Hypertension   . Obesity     Past Surgical History:  Procedure Laterality Date  . CESAREAN SECTION  02/21/2005   with Tubal ligation    Social History   Social History  . Marital status: Married    Spouse name: N/A  . Number of children: 2  . Years of education: N/A  Occupational History  . RN    Social History Main Topics  . Smoking status: Never Smoker  . Smokeless tobacco: Never Used  . Alcohol use 4.2 oz/week    7 Standard drinks or equivalent per week     Comment: red wine at night  . Drug use: No  . Sexual activity: Not Asked   Other Topics Concern  . 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 Diabetes Mother   . Diabetes Maternal Aunt     all maternal side  . Diabetes Sister     x 2  . Hypertension Maternal Aunt     all maternal side  . Lymphoma Father   . Heart attack Maternal Aunt   . Dementia Maternal Aunt     Review of Systems  Constitutional: Negative for chills and fever.    Respiratory: Negative for cough, shortness of breath and wheezing.   Cardiovascular: Negative for chest pain, palpitations and leg swelling.  Gastrointestinal:       Jerrye Bushy controlled  Neurological: Negative for dizziness, light-headedness and headaches.       Objective:   Vitals:   06/13/16 0929  BP: (!) 130/94  Pulse: 71  Resp: 16  Temp: 98 F (36.7 C)   Wt Readings from Last 3 Encounters:  06/13/16 (!) 345 lb (156.5 kg)  09/19/15 (!) 351 lb (159.2 kg)  04/25/15 (!) 352 lb (159.7 kg)   Body mass index is 49.5 kg/m.   Physical Exam    Constitutional: Appears well-developed and well-nourished. No distress.  HENT:  Head: Normocephalic and atraumatic.  Neck: Neck supple. No tracheal deviation present. No thyromegaly present.  No cervical lymphadenopathy Cardiovascular: Normal rate, regular rhythm and normal heart sounds.   No murmur heard. No carotid bruit .  No edema Pulmonary/Chest: Effort normal and breath sounds normal. No respiratory distress. No has no wheezes. No rales.  Skin: Skin is warm and dry. Not diaphoretic.  Psychiatric: Normal mood and affect. Behavior is normal.      Assessment & Plan:    See Problem List for Assessment and Plan of chronic medical problems.   FU in 6 months

## 2016-06-15 ENCOUNTER — Other Ambulatory Visit: Payer: Self-pay | Admitting: Internal Medicine

## 2016-06-15 NOTE — Assessment & Plan Note (Signed)
Check a1c Continue regular exercise and work on weight loss Will likely need medication - discussed options - would like to start something that promotes weight loss as well

## 2016-06-15 NOTE — Assessment & Plan Note (Signed)
GERD controlled Continue daily medication  

## 2016-06-15 NOTE — Assessment & Plan Note (Signed)
Well controlled Continue current dose of lexapro

## 2016-06-15 NOTE — Assessment & Plan Note (Signed)
On birth control and has not had any migraines imitrex as needed

## 2016-06-16 ENCOUNTER — Other Ambulatory Visit: Payer: Self-pay | Admitting: Emergency Medicine

## 2016-06-16 MED ORDER — METFORMIN HCL 500 MG PO TABS: 500.0000 mg | ORAL_TABLET | Freq: Two times a day (BID) | ORAL | 0 refills | Status: DC

## 2016-06-16 MED ORDER — METFORMIN HCL 500 MG PO TABS: 500.0000 mg | ORAL_TABLET | Freq: Two times a day (BID) | ORAL | 1 refills | Status: DC

## 2016-06-16 NOTE — Progress Notes (Signed)
Please advise on Victoza sig. Pend and route back to me. Pt would like short supply sent to local and then 90 day to mail order. Thanks  RXs have been send.

## 2016-06-17 ENCOUNTER — Telehealth: Payer: Self-pay | Admitting: Internal Medicine

## 2016-06-17 ENCOUNTER — Telehealth: Payer: Self-pay | Admitting: *Deleted

## 2016-06-17 MED ORDER — LIRAGLUTIDE 18 MG/3ML ~~LOC~~ SOPN: PEN_INJECTOR | SUBCUTANEOUS | 1 refills | Status: DC

## 2016-06-17 MED ORDER — INSULIN PEN NEEDLE 32G X 4 MM MISC: 1 refills | Status: DC

## 2016-06-17 MED ORDER — DULAGLUTIDE 0.75 MG/0.5ML ~~LOC~~ SOAJ: 0.7500 mg | SUBCUTANEOUS | 5 refills | Status: DC

## 2016-06-17 MED ORDER — LIRAGLUTIDE 18 MG/3ML ~~LOC~~ SOPN: PEN_INJECTOR | SUBCUTANEOUS | 0 refills | Status: DC

## 2016-06-17 MED ORDER — INSULIN PEN NEEDLE 32G X 4 MM MISC: 0 refills | Status: DC

## 2016-06-17 NOTE — Telephone Encounter (Signed)
Rec'd fax stating the Victoza that was rx is not on pt formulary plan insurance will not cover. The alternatives are Bydureon pens or vials, Byetta pen, and Trulicity pen...Johny Chess

## 2016-06-17 NOTE — Telephone Encounter (Signed)
Pt called in about a med that you spoke with her about yesterday?  She wanted to know if you were able to call it in ?

## 2016-06-17 NOTE — Telephone Encounter (Signed)
Changed to trulicity - let her know  -- this is once a week injection

## 2016-06-17 NOTE — Telephone Encounter (Signed)
Spoke with pt to inform both medication have now been sent to [facility].

## 2016-06-18 NOTE — Telephone Encounter (Signed)
Called pt no answer LMOM w/med change...Johny Chess

## 2016-06-19 ENCOUNTER — Telehealth: Payer: Self-pay | Admitting: Emergency Medicine

## 2016-06-19 NOTE — Telephone Encounter (Signed)
Entered in Error

## 2016-06-20 ENCOUNTER — Telehealth: Payer: Self-pay | Admitting: Internal Medicine

## 2016-06-20 MED ORDER — DULAGLUTIDE 0.75 MG/0.5ML ~~LOC~~ SOAJ: 0.7500 mg | SUBCUTANEOUS | 1 refills | Status: DC

## 2016-06-20 NOTE — Telephone Encounter (Signed)
RX sent to Mail order

## 2016-06-20 NOTE — Telephone Encounter (Signed)
Pt called stated she pick up the first set of Trulicity at [facility] but she [facility] the [facility]. Please advise.

## 2016-06-24 ENCOUNTER — Telehealth: Payer: Self-pay | Admitting: Internal Medicine

## 2016-06-24 ENCOUNTER — Encounter (HOSPITAL_COMMUNITY): Payer: Self-pay | Admitting: Emergency Medicine

## 2016-06-24 DIAGNOSIS — Z5321 Procedure and treatment not carried out due to patient leaving prior to being seen by health care provider: Secondary | ICD-10-CM | POA: Diagnosis not present

## 2016-06-24 DIAGNOSIS — R109 Unspecified abdominal pain: Secondary | ICD-10-CM | POA: Insufficient documentation

## 2016-06-24 LAB — CBC WITH DIFFERENTIAL/PLATELET
BASOS PCT: 0 %
Basophils Absolute: 0 10*3/uL (ref 0.0–0.1)
Eosinophils Absolute: 0 10*3/uL (ref 0.0–0.7)
Eosinophils Relative: 0 %
HEMATOCRIT: 39.6 % (ref 36.0–46.0)
HEMOGLOBIN: 12.8 g/dL (ref 12.0–15.0)
Lymphocytes Relative: 23 %
Lymphs Abs: 2.3 10*3/uL (ref 0.7–4.0)
MCH: 27.4 pg (ref 26.0–34.0)
MCHC: 32.3 g/dL (ref 30.0–36.0)
MCV: 84.6 fL (ref 78.0–100.0)
MONOS PCT: 4 %
Monocytes Absolute: 0.4 10*3/uL (ref 0.1–1.0)
NEUTROS ABS: 7.1 10*3/uL (ref 1.7–7.7)
Neutrophils Relative %: 73 %
Platelets: 478 10*3/uL — ABNORMAL HIGH (ref 150–400)
RBC: 4.68 MIL/uL (ref 3.87–5.11)
RDW: 14.7 % (ref 11.5–15.5)
WBC: 9.7 10*3/uL (ref 4.0–10.5)

## 2016-06-24 LAB — COMPREHENSIVE METABOLIC PANEL
ALK PHOS: 99 U/L (ref 38–126)
ALT: 27 U/L (ref 14–54)
ANION GAP: 10 (ref 5–15)
AST: 28 U/L (ref 15–41)
Albumin: 3.4 g/dL — ABNORMAL LOW (ref 3.5–5.0)
BILIRUBIN TOTAL: 0.6 mg/dL (ref 0.3–1.2)
BUN: 8 mg/dL (ref 6–20)
CALCIUM: 9.3 mg/dL (ref 8.9–10.3)
CO2: 23 mmol/L (ref 22–32)
Chloride: 102 mmol/L (ref 101–111)
Creatinine, Ser: 0.98 mg/dL (ref 0.44–1.00)
GFR calc Af Amer: >60 mL/min (ref >60)
GLUCOSE: 130 mg/dL — AB (ref 65–99)
Potassium: 3.9 mmol/L (ref 3.5–5.1)
Sodium: 135 mmol/L (ref 135–145)
TOTAL PROTEIN: 7.8 g/dL (ref 6.5–8.1)

## 2016-06-24 LAB — URINALYSIS, ROUTINE W REFLEX MICROSCOPIC
BILIRUBIN URINE: NEGATIVE
Glucose, UA: NEGATIVE mg/dL
Hgb urine dipstick: NEGATIVE
Ketones, ur: 20 mg/dL — AB
LEUKOCYTES UA: NEGATIVE
Nitrite: NEGATIVE
PH: 5 (ref 5.0–8.0)
Protein, ur: 100 mg/dL — AB
SPECIFIC GRAVITY, URINE: 1.031 — AB (ref 1.005–1.030)

## 2016-06-24 LAB — PREGNANCY, URINE: Preg Test, Ur: NEGATIVE

## 2016-06-24 LAB — CBG MONITORING, ED: Glucose-Capillary: 138 mg/dL — ABNORMAL HIGH (ref 65–99)

## 2016-06-24 MED ORDER — BLOOD GLUCOSE MONITOR KIT: PACK | 0 refills | Status: AC

## 2016-06-24 NOTE — Telephone Encounter (Signed)
Please advise 

## 2016-06-24 NOTE — Telephone Encounter (Signed)
The 2 pills of metformin are causing severe abdominal pain and nausea.  Patient states she cannot tolerate that dose.  Wants to know if it just has to get out of her system or if there is something else she can do.

## 2016-06-24 NOTE — Telephone Encounter (Signed)
Spoke with pt, she is not able to take her blood sugars bc she does not have a kit. Kit sent to [facility]. Pt will try taking 1 metformin daily.

## 2016-06-24 NOTE — ED Triage Notes (Signed)
Pt st's she was recently started on Metformin.  St's she was only taking 1 day for a week (as prescribed) then increased it to BID yesterday (as prescrbed)  Pt st's after taking the second pill yesterday she started having abd cramping with nausea, no vomiting.  St's she can't eat anything

## 2016-06-24 NOTE — Telephone Encounter (Signed)
If she can take one metformin a day without symptoms she should take 1 -  If even one causes symptoms she can stop it.  How are her sugars?

## 2016-06-25 ENCOUNTER — Emergency Department (HOSPITAL_COMMUNITY)
Admission: EM | Admit: 2016-06-25 | Discharge: 2016-06-25 | Disposition: A | Discharge status: Home / Self Care | Payer: 59 | Attending: Emergency Medicine | Admitting: Emergency Medicine

## 2016-06-25 HISTORY — DX: Type 2 diabetes mellitus without complications: E11.9

## 2016-08-27 ENCOUNTER — Encounter: Payer: Self-pay | Admitting: Internal Medicine

## 2016-08-27 ENCOUNTER — Ambulatory Visit (INDEPENDENT_AMBULATORY_CARE_PROVIDER_SITE_OTHER): Payer: 59 | Admitting: Internal Medicine

## 2016-08-27 VITALS — BP 118/78 | HR 86 | Temp 98.1°F | Resp 16 | Wt 331.0 lb

## 2016-08-27 DIAGNOSIS — K219 Gastro-esophageal reflux disease without esophagitis: Secondary | ICD-10-CM | POA: Diagnosis not present

## 2016-08-27 DIAGNOSIS — K2289 Other specified disease of esophagus: Secondary | ICD-10-CM | POA: Insufficient documentation

## 2016-08-27 DIAGNOSIS — K228 Other specified diseases of esophagus: Secondary | ICD-10-CM | POA: Diagnosis not present

## 2016-08-27 MED ORDER — RANITIDINE HCL 150 MG PO TABS: 150.0000 mg | ORAL_TABLET | Freq: Every day | ORAL | 1 refills | Status: DC

## 2016-08-27 MED ORDER — SUCRALFATE 1 G PO TABS: 1.0000 g | ORAL_TABLET | Freq: Three times a day (TID) | ORAL | 0 refills | Status: DC

## 2016-08-27 NOTE — Patient Instructions (Signed)
Start zantac at bedtime.    Use the carafate temporarily to help heal your stomach.   Make revisions in your diet.   Continue your weight loss efforts.   Call if no improvement    Heartburn Heartburn is a type of pain or discomfort that can happen in the throat or chest. It is often described as a burning pain. It may also cause a bad taste in the mouth. Heartburn may feel worse when you lie down or bend over, and it is often worse at night. Heartburn may be caused by stomach contents that move back up into the esophagus (reflux). Follow these instructions at home: Take these actions to decrease your discomfort and to help avoid complications. Diet   Follow a diet as recommended by your health care provider. This may involve avoiding foods and drinks such as:  Coffee and tea (with or without caffeine).  Drinks that contain alcohol.  Energy drinks and sports drinks.  Carbonated drinks or sodas.  Chocolate and cocoa.  Peppermint and mint flavorings.  Garlic and onions.  Horseradish.  Spicy and acidic foods, including peppers, chili powder, curry powder, vinegar, hot sauces, and barbecue sauce.  Citrus fruit juices and citrus fruits, such as oranges, lemons, and limes.  Tomato-based foods, such as red sauce, chili, salsa, and pizza with red sauce.  Fried and fatty foods, such as donuts, french fries, potato chips, and high-fat dressings.  High-fat meats, such as hot dogs and fatty cuts of red and white meats, such as rib eye steak, sausage, ham, and bacon.  High-fat dairy items, such as whole milk, butter, and cream cheese.  Eat small, frequent meals instead of large meals.  Avoid drinking large amounts of liquid with your meals.  Avoid eating meals during the 2-3 hours before bedtime.  Avoid lying down right after you eat.  Do not exercise right after you eat. General instructions   Pay attention to any changes in your symptoms.  Take over-the-counter and  prescription medicines only as told by your health care provider. Do not take aspirin, ibuprofen, or other NSAIDs unless your health care provider told you to do so.  Do not use any tobacco products, including cigarettes, chewing tobacco, and e-cigarettes. If you need help quitting, ask your health care provider.  Wear loose-fitting clothing. Do not wear anything tight around your waist that causes pressure on your abdomen.  Raise (elevate) the head of your bed about 6 inches (15 cm).  Try to reduce your stress, such as with yoga or meditation. If you need help reducing stress, ask your health care provider.  If you are overweight, reduce your weight to an amount that is healthy for you. Ask your health care provider for guidance about a safe weight loss goal.  Keep all follow-up visits as told by your health care provider. This is important. Contact a health care provider if:  You have new symptoms.  You have unexplained weight loss.  You have difficulty swallowing, or it hurts to swallow.  You have wheezing or a persistent cough.  Your symptoms do not improve with treatment.  You have frequent heartburn for more than two weeks. Get help right away if:  You have pain in your arms, neck, jaw, teeth, or back.  You feel sweaty, dizzy, or light-headed.  You have chest pain or shortness of breath.  You vomit and your vomit looks like blood or coffee grounds.  Your stool is bloody or black. This information is not  intended to replace advice given to you by your health care provider. Make sure you discuss any questions you have with your health care provider. Document Released: 08/10/2008 Document Revised: 08/30/2015 Document Reviewed: 07/19/2014 Elsevier Interactive Patient Education  2017 [employer].

## 2016-08-27 NOTE — Assessment & Plan Note (Signed)
Not ideally controlled Start zantac at night Continue nexium daily carafate - short term use only - it did help, but the carafate she has at home is expired - will prescribe this due to epigastric pain and concern for possible gastritis or early ulcer Revise diet - information given Call if no improvement

## 2016-08-27 NOTE — Progress Notes (Signed)
Subjective:    Patient ID: Debra Le, female    DOB: Mar 11, 1976, 41 y.o.   MRN: [id number]  HPI She is here for an acute visit.   ? Ulcer or indigestion:  It started three weeks ago.  She has intermittent pain in her epigastric region that feels like it goes to her back.  Certain foods have caused the pain, such as lasagna and carbonated drinks.  She has had increased GERD and burping.  She is taking her nexium daily and previously would get GERD 1-2 times a month.  She denies changes in her diet.  She does have increased stress.  She has taken Carafate, Tums and Mylanta. They do help.    She takes aleve occasional.  No coffee.  Drinks carbonated beverages  She does not have any pain today.   Medications and allergies reviewed with patient and updated if appropriate.  Patient Active Problem List   Diagnosis Date Noted  . Endometriosis 06/13/2016  . Depression 06/13/2016  . Diabetes ([place]) 09/20/2015  . Keloid 09/19/2015  . Lump of skin 09/19/2015  . Migraines 09/19/2015  . Essential hypertension, benign 03/09/2015  . GERD (gastroesophageal reflux disease) 03/09/2015  . Anxiety 03/09/2015  . Vitamin D deficiency 03/09/2015  . Morbid obesity ([place]) 03/09/2015    Current Outpatient Prescriptions on File Prior to Visit  Medication Sig Dispense Refill  . amLODipine (NORVASC) 5 MG tablet Take 1 tablet (5 mg total) by mouth daily. 90 tablet 3  . BIOTIN PO Take by mouth.    . blood glucose meter kit and supplies KIT Dispense based on patient and insurance preference. Use up to four times daily as directed. 1 each 0  . buPROPion (WELLBUTRIN XL) 300 MG 24 hr tablet Take 1 tablet (300 mg total) by mouth daily. 90 tablet 3  . Cholecalciferol (VITAMIN D3) 3000 UNITS TABS Take by mouth.    Marland Kitchen CINNAMON PO Take by mouth.    . Dulaglutide (TRULICITY) 5.03 TW/6.5KC SOPN Inject 0.75 mg into the skin once a week. 12 pen 1  . escitalopram (LEXAPRO) 10 MG tablet Take 1 tablet (10 mg total) by  mouth daily. 90 tablet 1  . esomeprazole (NEXIUM) 40 MG capsule Take 1 capsule (40 mg total) by mouth daily. 90 capsule 3  . Fish Oil OIL by Does not apply route.    . Insulin Pen Needle (BD PEN NEEDLE NANO U/F) 32G X 4 MM MISC Use daily with victoza 90 each 1  . metFORMIN (GLUCOPHAGE) 500 MG tablet Take 1 tablet (500 mg total) by mouth 2 (two) times daily with a meal. 180 tablet 1  . metoprolol tartrate (LOPRESSOR) 25 MG tablet TAKE 1 TABLET (25 MG TOTAL) BY MOUTH 2 (TWO) TIMES DAILY. 180 tablet 3  . norgestimate-ethinyl estradiol (ORTHO-CYCLEN,SPRINTEC,PREVIFEM) 0.25-35 MG-MCG tablet Take 1 tablet by mouth daily.    . SUMAtriptan (IMITREX) 100 MG tablet Take 1 tablet (100 mg total) by mouth every 2 (two) hours as needed for migraine. May repeat in 2 hours if headache persists or recurs. 10 tablet 5  . traMADol (ULTRAM) 50 MG tablet Take 1 tablet (50 mg total) by mouth every 8 (eight) hours as needed. 30 tablet 0   No current facility-administered medications on file prior to visit.     Past Medical History:  Diagnosis Date  . Anxiety   . Depression   . Diabetes mellitus without complication ([place])   . GERD (gastroesophageal reflux disease)   . Hypertension   .  Obesity     Past Surgical History:  Procedure Laterality Date  . CESAREAN SECTION  02/21/2005   with Tubal ligation    Social History   Social History  . Marital status: Married    Spouse name: N/A  . Number of children: 2  . Years of education: N/A   Occupational History  . RN    Social History Main Topics  . Smoking status: Never Smoker  . Smokeless tobacco: Never Used  . Alcohol use 4.2 oz/week    7 Standard drinks or equivalent per week     Comment: red wine at night  . Drug use: No  . Sexual activity: Not on file   Other Topics Concern  . 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 Diabetes Mother   . Diabetes  Maternal Aunt        all maternal side  . Diabetes Sister        x 2  . Hypertension Maternal Aunt        all maternal side  . Lymphoma Father   . Heart attack Maternal Aunt   . Dementia Maternal Aunt     Review of Systems  Constitutional: Negative for chills and fever.  Respiratory: Negative for shortness of breath.   Cardiovascular: Negative for chest pain.  Gastrointestinal: Positive for abdominal pain (epigastric region). Negative for blood in stool (no black stool), constipation, diarrhea and nausea.  Neurological: Negative for light-headedness and headaches.       Objective:   Vitals:   08/27/16 1131  BP: 118/78  Pulse: 86  Resp: 16  Temp: 98.1 F (36.7 C)   Filed Weights   08/27/16 1131  Weight: (!) 331 lb (150.1 kg)   Body mass index is 47.49 kg/m.  Wt Readings from Last 3 Encounters:  08/27/16 (!) 331 lb (150.1 kg)  06/24/16 (!) 340 lb (154.2 kg)  06/13/16 (!) 345 lb (156.5 kg)     Physical Exam  Constitutional: She appears well-developed and well-nourished. No distress.  HENT:  Head: Normocephalic and atraumatic.  Abdominal: Soft. She exhibits no distension and no mass. There is no tenderness. There is no rebound and no guarding.  Musculoskeletal: She exhibits no edema.  Skin: Skin is warm and dry. She is not diaphoretic.          Assessment & Plan:   See Problem List for Assessment and Plan of chronic medical problems.

## 2016-08-27 NOTE — Assessment & Plan Note (Signed)
Related to uncontrolled GERD, possible early gastritis or ulcer Start zantac at night Continue nexium daily carafate - short term use only - it did help, but the carafate she has at home is expired - will prescribe this due to epigastric pain and concern for possible gastritis or early ulcer Revise diet - information given Call if no improvement

## 2016-10-09 ENCOUNTER — Other Ambulatory Visit: Payer: Self-pay | Admitting: Internal Medicine

## 2016-11-17 ENCOUNTER — Other Ambulatory Visit: Payer: Self-pay | Admitting: Internal Medicine

## 2016-11-28 ENCOUNTER — Other Ambulatory Visit: Payer: Self-pay | Admitting: Emergency Medicine

## 2016-11-28 MED ORDER — DULAGLUTIDE 0.75 MG/0.5ML ~~LOC~~ SOAJ: 0.7500 mg | SUBCUTANEOUS | 0 refills | Status: DC

## 2016-11-28 NOTE — Telephone Encounter (Signed)
PA for Trulicity has been approved through 11/27/2019. [facility]

## 2016-12-15 ENCOUNTER — Ambulatory Visit: Payer: 59 | Admitting: Internal Medicine

## 2017-01-09 ENCOUNTER — Ambulatory Visit: Payer: 59 | Admitting: Internal Medicine

## 2017-02-03 ENCOUNTER — Other Ambulatory Visit: Payer: Self-pay | Admitting: Internal Medicine

## 2017-02-03 NOTE — Progress Notes (Signed)
Subjective:    Patient ID: Debra Le, female    DOB: April 02, 1976, 41 y.o.   MRN: [id number]  HPI   Patient Active Problem List   Diagnosis Date Noted  . Esophageal pain 08/27/2016  . Endometriosis 06/13/2016  . Depression 06/13/2016  . Diabetes ([place]) 09/20/2015  . Keloid 09/19/2015  . Lump of skin 09/19/2015  . Migraines 09/19/2015  . Essential hypertension, benign 03/09/2015  . GERD (gastroesophageal reflux disease) 03/09/2015  . Anxiety 03/09/2015  . Vitamin D deficiency 03/09/2015  . Morbid obesity ([place]) 03/09/2015    Current Outpatient Prescriptions on File Prior to Visit  Medication Sig Dispense Refill  . amLODipine (NORVASC) 5 MG tablet Take 1 tablet (5 mg total) by mouth daily. 90 tablet 3  . BIOTIN PO Take by mouth.    . blood glucose meter kit and supplies KIT Dispense based on patient and insurance preference. Use up to four times daily as directed. 1 each 0  . buPROPion (WELLBUTRIN XL) 300 MG 24 hr tablet Take 1 tablet (300 mg total) by mouth daily. 90 tablet 3  . Cholecalciferol (VITAMIN D3) 3000 UNITS TABS Take by mouth.    Marland Kitchen CINNAMON PO Take by mouth.    . Dulaglutide (TRULICITY) 8.91 QX/4.5WT SOPN Inject 0.75 mg into the skin once a week. 12 pen 0  . escitalopram (LEXAPRO) 10 MG tablet TAKE 1 TABLET DAILY 90 tablet 0  . esomeprazole (NEXIUM) 40 MG capsule Take 1 capsule (40 mg total) by mouth daily. 90 capsule 3  . Fish Oil OIL by Does not apply route.    Marland Kitchen FREESTYLE LITE test strip USE AS DIRECTED TO TEST BLOOD SUGAR 4 TIMES A DAY 100 each 0  . Insulin Pen Needle (BD PEN NEEDLE NANO U/F) 32G X 4 MM MISC Use daily with victoza 90 each 1  . Lancets (FREESTYLE) lancets USE AS DIRECTED TO TEST BLOOD SUGAR 4 TIMES A DAY 100 each 0  . metFORMIN (GLUCOPHAGE) 500 MG tablet Take 1 tablet (500 mg total) by mouth 2 (two) times daily with a meal. 180 tablet 1  . metoprolol tartrate (LOPRESSOR) 25 MG tablet TAKE 1 TABLET (25 MG TOTAL) BY MOUTH 2 (TWO) TIMES DAILY. 180  tablet 3  . norgestimate-ethinyl estradiol (ORTHO-CYCLEN,SPRINTEC,PREVIFEM) 0.25-35 MG-MCG tablet Take 1 tablet by mouth daily.    . ranitidine (ZANTAC) 150 MG tablet Take 1 tablet (150 mg total) by mouth at bedtime. 90 tablet 1  . sucralfate (CARAFATE) 1 g tablet Take 1 tablet (1 g total) by mouth 4 (four) times daily -  with meals and at bedtime. 40 tablet 0  . SUMAtriptan (IMITREX) 100 MG tablet Take 1 tablet (100 mg total) by mouth every 2 (two) hours as needed for migraine. May repeat in 2 hours if headache persists or recurs. 10 tablet 5  . traMADol (ULTRAM) 50 MG tablet Take 1 tablet (50 mg total) by mouth every 8 (eight) hours as needed. 30 tablet 0   No current facility-administered medications on file prior to visit.     Past Medical History:  Diagnosis Date  . Anxiety   . Depression   . Diabetes mellitus without complication ([place])   . GERD (gastroesophageal reflux disease)   . Hypertension   . Obesity     Past Surgical History:  Procedure Laterality Date  . CESAREAN SECTION  02/21/2005   with Tubal ligation    Social History   Social History  . Marital status: Married  Spouse name: N/A  . Number of children: 2  . Years of education: N/A   Occupational History  . RN    Social History Main Topics  . Smoking status: Never Smoker  . Smokeless tobacco: Never Used  . Alcohol use 4.2 oz/week    7 Standard drinks or equivalent per week     Comment: red wine at night  . Drug use: No  . Sexual activity: Not on file   Other Topics Concern  . 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 Diabetes Mother   . Diabetes Maternal Aunt        all maternal side  . Diabetes Sister        x 2  . Hypertension Maternal Aunt        all maternal side  . Lymphoma Father   . Heart attack Maternal Aunt   . Dementia Maternal Aunt     Review of Systems     Objective:  There were  no vitals filed for this visit. Wt Readings from Last 3 Encounters:  08/27/16 (!) 331 lb (150.1 kg)  06/24/16 (!) 340 lb (154.2 kg)  06/13/16 (!) 345 lb (156.5 kg)   There is no height or weight on file to calculate BMI.   Physical Exam         Assessment & Plan:    See Problem List for Assessment and Plan of chronic medical problems.   This encounter was created in error - please disregard.

## 2017-02-04 ENCOUNTER — Encounter: Payer: 59 | Admitting: Internal Medicine

## 2017-02-15 ENCOUNTER — Other Ambulatory Visit: Payer: Self-pay | Admitting: Internal Medicine

## 2017-02-16 ENCOUNTER — Other Ambulatory Visit: Payer: Self-pay | Admitting: Internal Medicine

## 2017-03-17 ENCOUNTER — Ambulatory Visit: Payer: 59 | Admitting: Internal Medicine

## 2017-03-22 NOTE — Patient Instructions (Addendum)
  Test(s) ordered today. Your results will be released to [facility] (or called to you) after review, usually within 72hours after test completion. If any changes need to be made, you will be notified at that same time.  All other Health Maintenance issues reviewed.   All recommended immunizations and age-appropriate screenings are up-to-date or discussed.  No immunizations administered today.   Medications reviewed and updated.  Changes include increasing the trulicity to 1.5 mg   Your prescription(s) have been submitted to your pharmacy. Please take as directed and contact our office if you believe you are having problem(s) with the medication(s).   Please followup in 6 months

## 2017-03-22 NOTE — Progress Notes (Signed)
Subjective:    Patient ID: Debra Le, female    DOB: 1975/09/13, 41 y.o.   MRN: [id number]  HPI The patient is here for follow up.  Diabetes: She is taking her medication daily as prescribed-she did run out of the Trulicity recently and does need a refill.  She is done well with the medication.  She denies side effects. She is compliant with a diabetic diet. She is exercising regularly - started with a trainer 2/week.  She just started the exercise, but plans on continuing it.  She monitors her sugars and they have been running 80-121, occasionally to 150's depending on what she eats. She checks her feet daily and denies foot lesions. She is up-to-date with an ophthalmology examination.   Hypertension: She is taking her medication daily. She is compliant with a low sodium diet.  She denies chest pain, palpitations, edema, shortness of breath and regular headaches. She is exercising regularly, but just recently started.  She does not monitor her blood pressure at home.    GERD:  She is taking her medication daily as prescribed.  She denies any GERD symptoms and feels her GERD is well controlled.   Depression: She is taking her medication daily as prescribed. She denies any side effects from the medication. She feels her depression is well controlled and she is happy with her current dose of medication.   Anxiety: She is taking her medication daily as prescribed. She denies any side effects from the medication. She feels her anxiety is well controlled and she is happy with her current dose of medication.  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  She will be switching jobs soon to a more conducive job for her lifestyle.  Overall she feels well and does not feel depressed or anxious.  She may try to taper off of the Lexapro at some point.   Medications and allergies reviewed with patient and updated if appropriate.  Patient Active Problem List   Diagnosis Date Noted  . Esophageal pain 08/27/2016  . Endometriosis 06/13/2016  . Depression 06/13/2016  . Diabetes ([place]) 09/20/2015  . Keloid 09/19/2015  . Lump of skin 09/19/2015  . Migraines 09/19/2015  . Essential hypertension, benign 03/09/2015  . GERD (gastroesophageal reflux disease) 03/09/2015  . Anxiety 03/09/2015  . Vitamin D deficiency 03/09/2015  . Morbid obesity ([place]) 03/09/2015    Current Outpatient Medications on File Prior to Visit  Medication Sig Dispense Refill  . amLODipine (NORVASC) 5 MG tablet Take 1 tablet (5 mg total) by mouth daily. 90 tablet 3  . blood glucose meter kit and supplies KIT Dispense based on patient and insurance preference. Use up to four times daily as directed. 1 each 0  . buPROPion (WELLBUTRIN XL) 300 MG 24 hr tablet Take 1 tablet (300 mg total) by mouth daily. 90 tablet 3  . Dulaglutide (TRULICITY) 4.69 GE/9.5MW SOPN Inject 0.75 mg into the skin once a week. 12 pen 0  . escitalopram (LEXAPRO) 10 MG tablet TAKE 1 TABLET DAILY 90 tablet 1  . esomeprazole (NEXIUM) 40 MG capsule Take 1 capsule (40 mg total) by mouth daily. 90 capsule 3  . metFORMIN (GLUCOPHAGE) 500 MG tablet TAKE 1 TABLET TWICE A DAY WITH MEALS 180 tablet 1  . metoprolol tartrate (LOPRESSOR) 25 MG tablet TAKE 1 TABLET (25 MG TOTAL) BY MOUTH 2 (TWO) TIMES DAILY. 180 tablet 3  . norgestimate-ethinyl estradiol (ORTHO-CYCLEN,SPRINTEC,PREVIFEM) 0.25-35 MG-MCG tablet Take 1 tablet  by mouth daily.    . ranitidine (ZANTAC) 150 MG tablet Take 1 tablet (150 mg total) by mouth at bedtime. 90 tablet 1   No current facility-administered medications on file prior to visit.     Past Medical History:  Diagnosis Date  . Anxiety   . Depression   . Diabetes mellitus without complication ([place])   . GERD (gastroesophageal reflux disease)   . Hypertension   . Obesity     Past Surgical History:  Procedure Laterality Date  . CESAREAN SECTION  02/21/2005   with Tubal ligation    Social  History   Socioeconomic History  . Marital status: Married    Spouse name: None  . Number of children: 2  . Years of education: None  . Highest education level: None  Social Needs  . Financial resource strain: None  . Food insecurity - worry: None  . Food insecurity - inability: None  . Transportation needs - medical: None  . Transportation needs - non-medical: None  Occupational History  . Occupation: Therapist, sports  Tobacco Use  . Smoking status: Never Smoker  . Smokeless tobacco: Never Used  Substance and Sexual Activity  . Alcohol use: Yes    Alcohol/week: 4.2 oz    Types: 7 Standard drinks or equivalent per week    Comment: red wine at night  . Drug use: No  . Sexual activity: None  Other Topics Concern  . 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 Diabetes Mother   . Diabetes Maternal Aunt        all maternal side  . Diabetes Sister        x 2  . Hypertension Maternal Aunt        all maternal side  . Lymphoma Father   . Heart attack Maternal Aunt   . Dementia Maternal Aunt     Review of Systems  Constitutional: Negative for chills and fever.  Respiratory: Negative for cough, shortness of breath and wheezing.   Cardiovascular: Negative for chest pain, palpitations and leg swelling.  Neurological: Negative for light-headedness and headaches.       Objective:   Vitals:   03/23/17 1457  BP: 134/88  Pulse: 75  Resp: 16  Temp: 97.7 F (36.5 C)  SpO2: 98%   Wt Readings from Last 3 Encounters:  03/23/17 (!) 327 lb (148.3 kg)  08/27/16 (!) 331 lb (150.1 kg)  06/24/16 (!) 340 lb (154.2 kg)   Body mass index is 46.92 kg/m.   Physical Exam    Constitutional: Appears well-developed and well-nourished. No distress.  HENT:  Head: Normocephalic and atraumatic.  Neck: Neck supple. No tracheal deviation present. No thyromegaly present.  No cervical lymphadenopathy Cardiovascular: Normal  rate, regular rhythm and normal heart sounds.   No murmur heard. No carotid bruit .  No edema Pulmonary/Chest: Effort normal and breath sounds normal. No respiratory distress. No has no wheezes. No rales.  Skin: Skin is warm and dry. Not diaphoretic.  Psychiatric: Normal mood and affect. Behavior is normal.   Diabetic Foot Exam - Simple   Simple Foot Form Diabetic Foot exam was performed with the following findings:  Yes   Visual Inspection No deformities, no ulcerations, no other skin breakdown bilaterally:  Yes Sensation Testing Intact to touch and monofilament testing bilaterally:  Yes Pulse Check Posterior Tibialis and Dorsalis pulse intact bilaterally:  Yes  Comments       Assessment & Plan:    See Problem List for Assessment and Plan of chronic medical problems.

## 2017-03-23 ENCOUNTER — Ambulatory Visit (INDEPENDENT_AMBULATORY_CARE_PROVIDER_SITE_OTHER): Payer: 59 | Admitting: Internal Medicine

## 2017-03-23 ENCOUNTER — Other Ambulatory Visit (INDEPENDENT_AMBULATORY_CARE_PROVIDER_SITE_OTHER): Payer: 59

## 2017-03-23 ENCOUNTER — Encounter: Payer: Self-pay | Admitting: Internal Medicine

## 2017-03-23 VITALS — BP 134/88 | HR 75 | Temp 97.7°F | Resp 16 | Ht 70.0 in | Wt 327.0 lb

## 2017-03-23 DIAGNOSIS — E119 Type 2 diabetes mellitus without complications: Secondary | ICD-10-CM | POA: Diagnosis not present

## 2017-03-23 DIAGNOSIS — E7849 Other hyperlipidemia: Secondary | ICD-10-CM | POA: Diagnosis not present

## 2017-03-23 DIAGNOSIS — E1169 Type 2 diabetes mellitus with other specified complication: Secondary | ICD-10-CM | POA: Insufficient documentation

## 2017-03-23 DIAGNOSIS — I10 Essential (primary) hypertension: Secondary | ICD-10-CM

## 2017-03-23 DIAGNOSIS — F419 Anxiety disorder, unspecified: Secondary | ICD-10-CM

## 2017-03-23 DIAGNOSIS — F32A Depression, unspecified: Secondary | ICD-10-CM

## 2017-03-23 DIAGNOSIS — F329 Major depressive disorder, single episode, unspecified: Secondary | ICD-10-CM | POA: Diagnosis not present

## 2017-03-23 DIAGNOSIS — K219 Gastro-esophageal reflux disease without esophagitis: Secondary | ICD-10-CM

## 2017-03-23 LAB — COMPREHENSIVE METABOLIC PANEL
ALBUMIN: 3.7 g/dL (ref 3.5–5.2)
ALT: 12 U/L (ref 0–35)
AST: 13 U/L (ref 0–37)
Alkaline Phosphatase: 106 U/L (ref 39–117)
BILIRUBIN TOTAL: 0.2 mg/dL (ref 0.2–1.2)
BUN: 12 mg/dL (ref 6–23)
CALCIUM: 9.1 mg/dL (ref 8.4–10.5)
CHLORIDE: 104 meq/L (ref 96–112)
CO2: 27 meq/L (ref 19–32)
CREATININE: 0.85 mg/dL (ref 0.40–1.20)
GFR: 94.81 mL/min (ref >60.00)
Glucose, Bld: 92 mg/dL (ref 70–99)
Potassium: 3.8 mEq/L (ref 3.5–5.1)
SODIUM: 137 meq/L (ref 135–145)
Total Protein: 7.9 g/dL (ref 6.0–8.3)

## 2017-03-23 LAB — LIPID PANEL
CHOL/HDL RATIO: 4
Cholesterol: 198 mg/dL (ref 0–200)
HDL: 48.7 mg/dL (ref >39.00)
LDL Cholesterol: 131 mg/dL — ABNORMAL HIGH (ref 0–99)
NONHDL: 149.44
TRIGLYCERIDES: 90 mg/dL (ref 0.0–149.0)
VLDL: 18 mg/dL (ref 0.0–40.0)

## 2017-03-23 LAB — TSH: TSH: 1.24 u[IU]/mL (ref 0.35–4.50)

## 2017-03-23 LAB — HEMOGLOBIN A1C: Hgb A1c MFr Bld: 7 % — ABNORMAL HIGH (ref 4.6–6.5)

## 2017-03-23 MED ORDER — DULAGLUTIDE 1.5 MG/0.5ML ~~LOC~~ SOAJ: 1.5000 mg | SUBCUTANEOUS | 3 refills | Status: DC

## 2017-03-23 NOTE — Assessment & Plan Note (Signed)
Currently well controlled on Lexapro Because of life changes may not need Lexapro any longer-we will consider tapering herself off

## 2017-03-23 NOTE — Assessment & Plan Note (Signed)
GERD controlled Continue daily medication Working on weight loss

## 2017-03-23 NOTE — Assessment & Plan Note (Signed)
BP well controlled Current regimen effective and well tolerated Continue current medications at current doses cmp,, TSH

## 2017-03-23 NOTE — Assessment & Plan Note (Signed)
Sugars well controlled at home We will check A1c today Continue low sugar/carbohydrate diet-she knows she can further reduce her sugars Continue regular exercise Work on weight loss Continue metformin at current dose Increase Trulicity to 1.5 mg weekly Follow-up in 6 months

## 2017-03-23 NOTE — Assessment & Plan Note (Signed)
Check lipid panel, CMP Not on a cholesterol-lowering medication Would prefer to hold off on medication and work on lifestyle over the next 6 months, which I agree with

## 2017-03-23 NOTE — Assessment & Plan Note (Signed)
Well-controlled Does not feel anxious Currently taking Lexapro-may try tapering herself off

## 2017-05-16 ENCOUNTER — Other Ambulatory Visit: Payer: Self-pay | Admitting: Internal Medicine

## 2017-06-15 ENCOUNTER — Telehealth: Payer: Self-pay | Admitting: Internal Medicine

## 2017-06-15 MED ORDER — AMLODIPINE BESYLATE 5 MG PO TABS: 5.0000 mg | ORAL_TABLET | Freq: Every day | ORAL | 0 refills | Status: DC

## 2017-06-15 MED ORDER — ESCITALOPRAM OXALATE 10 MG PO TABS: 10.0000 mg | ORAL_TABLET | Freq: Every day | ORAL | 0 refills | Status: DC

## 2017-06-15 NOTE — Telephone Encounter (Signed)
Copied from [place]. Topic: Quick Communication - Rx Refill/Question >> Jun 15, 2017  9:58 AM Synthia Innocent wrote: Medication: amLODipine (NORVASC) 5 MG tablet and escitalopram (LEXAPRO) 10 MG tablet , only need 15 day supply until new insurance starts   Has the patient contacted their pharmacy? Yes.     (Agent: If no, request that the patient contact the pharmacy for the refill.)   Preferred Pharm[facility] (with phone number [facility] street name[facility] [address]   Agent: Please be advised that RX refills may take up to 3 business days. We ask that you follow-up with your pharmacy.  Patient request to be called once sent [phone]

## 2017-06-15 NOTE — Telephone Encounter (Signed)
15 day supply requested for Amlodipine and Escitalopram  Last office visit: 03/23/17 PCP: Dr. Billey Gosling [facility] [address]   Refilled per request/ per refill protocol

## 2017-07-06 ENCOUNTER — Telehealth: Payer: Self-pay | Admitting: Internal Medicine

## 2017-07-06 MED ORDER — ESOMEPRAZOLE MAGNESIUM 40 MG PO CPDR: 40.0000 mg | DELAYED_RELEASE_CAPSULE | Freq: Every day | ORAL | 0 refills | Status: DC

## 2017-07-06 MED ORDER — DULAGLUTIDE 1.5 MG/0.5ML ~~LOC~~ SOAJ: 1.5000 mg | SUBCUTANEOUS | 0 refills | Status: DC

## 2017-07-06 MED ORDER — METOPROLOL TARTRATE 25 MG PO TABS: ORAL_TABLET | ORAL | 0 refills | Status: DC

## 2017-07-06 MED ORDER — METFORMIN HCL 500 MG PO TABS: 500.0000 mg | ORAL_TABLET | Freq: Two times a day (BID) | ORAL | 0 refills | Status: DC

## 2017-07-06 MED ORDER — AMLODIPINE BESYLATE 5 MG PO TABS: 5.0000 mg | ORAL_TABLET | Freq: Every day | ORAL | 0 refills | Status: DC

## 2017-07-06 MED ORDER — ESCITALOPRAM OXALATE 10 MG PO TABS: 10.0000 mg | ORAL_TABLET | Freq: Every day | ORAL | 0 refills | Status: DC

## 2017-07-06 NOTE — Telephone Encounter (Signed)
Needs sent in 90 day supply

## 2017-07-06 NOTE — Telephone Encounter (Signed)
Copied from [place] [id number]. Topic: Quick Communication - Rx Refill/Question >> Jul 06, 2017 10:16 AM Margot Ables wrote: Medication: pt called requesting all meds be refilled to new pharmacy - she states [facility] phone # [phone]  Amlodipine trulicity lexapro nexium Metformin Metoprolol

## 2017-07-16 DIAGNOSIS — Z1231 Encounter for screening mammogram for malignant neoplasm of breast: Secondary | ICD-10-CM | POA: Diagnosis not present

## 2017-07-16 DIAGNOSIS — Z01419 Encounter for gynecological examination (general) (routine) without abnormal findings: Secondary | ICD-10-CM | POA: Diagnosis not present

## 2017-07-16 DIAGNOSIS — Z124 Encounter for screening for malignant neoplasm of cervix: Secondary | ICD-10-CM | POA: Diagnosis not present

## 2017-08-07 DIAGNOSIS — J019 Acute sinusitis, unspecified: Secondary | ICD-10-CM | POA: Diagnosis not present

## 2017-09-22 ENCOUNTER — Ambulatory Visit: Payer: 59 | Admitting: Internal Medicine

## 2017-10-12 ENCOUNTER — Telehealth: Payer: Self-pay | Admitting: Internal Medicine

## 2017-10-12 MED ORDER — ESCITALOPRAM OXALATE 10 MG PO TABS: 10.0000 mg | ORAL_TABLET | Freq: Every day | ORAL | 0 refills | Status: DC

## 2017-10-12 MED ORDER — AMLODIPINE BESYLATE 5 MG PO TABS: 5.0000 mg | ORAL_TABLET | Freq: Every day | ORAL | 0 refills | Status: DC

## 2017-10-12 MED ORDER — METFORMIN HCL 500 MG PO TABS: 500.0000 mg | ORAL_TABLET | Freq: Two times a day (BID) | ORAL | 0 refills | Status: DC

## 2017-10-12 NOTE — Telephone Encounter (Signed)
Copied from [place] [id number]. Topic: Quick Communication - See Telephone Encounter >> Oct 12, 2017  8:19 AM Conception Chancy, NT wrote: CRM for notification. See Telephone encounter for: 10/12/17.  Patient is calling and requesting 10 pills of amLODipine (NORVASC) 5 MG tablet and escitalopram (LEXAPRO) 10 MG tablet. She also is requesting 20 pills of metFORMIN (GLUCOPHAGE) 500 MG tablet. Patient is going on a cruise next week and is needing these sent to a local pharmacy since [facility] normally uses [facility] and the medicine will not be here before she leaves. Patient has appointment 10/14/17. Please advise.   [id number] [place], [place] - [address] [place] [address] Phone: [phone] Fax: [phone]

## 2017-10-13 NOTE — Progress Notes (Signed)
Subjective:    Patient ID: Debra Le, female    DOB: [birth year], 42 y.o.   MRN: [id number]  HPI The patient is here for follow up.  Diabetes: She is taking her medication daily as prescribed. She is compliant with a diabetic diet. She is exercising regularly - trainer 2/week. She monitors her sugars and they have been running < 118 when she checks it. She checks her feet daily and denies foot lesions. She is up-to-date with an ophthalmology examination.   Hypertension: She is taking her medication daily. She is compliant with a low sodium diet.  She denies chest pain, palpitations, edema, shortness of breath and regular headaches. She is exercising regularly.  She does not monitor her blood pressure at home.    GERD:  She is taking her medication daily as prescribed.  She denies any GERD symptoms and feels her GERD is well controlled.   Depression: She is taking her medication daily as prescribed. She denies any side effects from the medication. She feels her depression is well controlled and she is happy with her current dose of medication.   Anxiety: She is taking her medication daily as prescribed. She denies any side effects from the medication. She feels her anxiety is well controlled and she is happy with her current dose of medication.    Medications and allergies reviewed with patient and updated if appropriate.  Patient Active Problem List   Diagnosis Date Noted  . Other hyperlipidemia 03/23/2017  . Esophageal pain 08/27/2016  . Endometriosis 06/13/2016  . Depression 06/13/2016  . Diabetes ([place]) 09/20/2015  . Keloid 09/19/2015  . Lump of skin 09/19/2015  . Migraines 09/19/2015  . Essential hypertension, benign 03/09/2015  . GERD (gastroesophageal reflux disease) 03/09/2015  . Anxiety 03/09/2015  . Vitamin D deficiency 03/09/2015  . Morbid obesity ([place]) 03/09/2015    Current Outpatient Medications on File Prior to Visit  Medication Sig Dispense Refill  .  amLODipine (NORVASC) 5 MG tablet Take 1 tablet (5 mg total) by mouth daily. 10 tablet 0  . blood glucose meter kit and supplies KIT Dispense based on patient and insurance preference. Use up to four times daily as directed. 1 each 0  . Dulaglutide (TRULICITY) 1.5 CB/4.4HQ SOPN Inject 1.5 mg into the skin once a week. 12 pen 0  . escitalopram (LEXAPRO) 10 MG tablet Take 1 tablet (10 mg total) by mouth daily. 10 tablet 0  . esomeprazole (NEXIUM) 40 MG capsule Take 1 capsule (40 mg total) by mouth daily. 90 capsule 0  . metFORMIN (GLUCOPHAGE) 500 MG tablet Take 1 tablet (500 mg total) by mouth 2 (two) times daily with a meal. 20 tablet 0  . metoprolol tartrate (LOPRESSOR) 25 MG tablet TAKE 1 TABLET TWICE A DAY 180 tablet 0  . norgestimate-ethinyl estradiol (ORTHO-CYCLEN,SPRINTEC,PREVIFEM) 0.25-35 MG-MCG tablet Take 1 tablet by mouth daily.    . ranitidine (ZANTAC) 150 MG tablet Take 1 tablet (150 mg total) by mouth at bedtime. 90 tablet 1   No current facility-administered medications on file prior to visit.     Past Medical History:  Diagnosis Date  . Anxiety   . Depression   . Diabetes mellitus without complication ([place])   . GERD (gastroesophageal reflux disease)   . Hypertension   . Obesity     Past Surgical History:  Procedure Laterality Date  . CESAREAN SECTION  02/21/2005   with Tubal ligation    Social History   Socioeconomic History  . Marital  status: Married    Spouse name: Not on file  . Number of children: 2  . Years of education: Not on file  . Highest education level: Not on file  Occupational History  . Occupation: Therapist, sports  Social Needs  . Financial resource strain: Not on file  . Food insecurity:    Worry: Not on file    Inability: Not on file  . Transportation needs:    Medical: Not on file    Non-medical: Not on file  Tobacco Use  . Smoking status: Never Smoker  . Smokeless tobacco: Never Used  Substance and Sexual Activity  . Alcohol use: Yes     Alcohol/week: 4.2 oz    Types: 7 Standard drinks or equivalent per week    Comment: red wine at night  . Drug use: No  . Sexual activity: Not on file  Lifestyle  . Physical activity:    Days per week: Not on file    Minutes per session: Not on file  . Stress: Not on file  Relationships  . Social connections:    Talks on phone: Not on file    Gets together: Not on file    Attends religious service: Not on file    Active member of club or organization: Not on file    Attends meetings of clubs or organizations: Not on file    Relationship status: Not on file  Other Topics Concern  . 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 Diabetes Mother   . Diabetes Maternal Aunt        all maternal side  . Diabetes Sister        x 2  . Hypertension Maternal Aunt        all maternal side  . Lymphoma Father   . Heart attack Maternal Aunt   . Dementia Maternal Aunt     Review of Systems  Constitutional: Negative for chills and fever.  Respiratory: Negative for cough, shortness of breath and wheezing.   Cardiovascular: Negative for chest pain, palpitations and leg swelling.  Neurological: Negative for light-headedness and headaches.       Objective:   Vitals:   10/14/17 0752  BP: (!) 144/90  Pulse: 86  Resp: 16  Temp: 98.3 F (36.8 C)  SpO2: 98%   BP Readings from Last 3 Encounters:  10/14/17 (!) 144/90  03/23/17 134/88  08/27/16 118/78   Wt Readings from Last 3 Encounters:  10/14/17 (!) 321 lb (145.6 kg)  03/23/17 (!) 327 lb (148.3 kg)  08/27/16 (!) 331 lb (150.1 kg)   Body mass index is 46.06 kg/m.   Physical Exam    Constitutional: Appears well-developed and well-nourished. No distress.  HENT:  Head: Normocephalic and atraumatic.  Neck: Neck supple. No tracheal deviation present. No thyromegaly present.  No cervical lymphadenopathy Cardiovascular: Normal rate, regular rhythm  and normal heart sounds.   No murmur heard. No carotid bruit .  No edema Pulmonary/Chest: Effort normal and breath sounds normal. No respiratory distress. No has no wheezes. No rales.  Skin: Skin is warm and dry. Not diaphoretic.  Psychiatric: Normal mood and affect. Behavior is normal.      Assessment & Plan:    See Problem List for Assessment and Plan of chronic medical problems.

## 2017-10-14 ENCOUNTER — Ambulatory Visit: Payer: 59 | Admitting: Internal Medicine

## 2017-10-14 ENCOUNTER — Other Ambulatory Visit (INDEPENDENT_AMBULATORY_CARE_PROVIDER_SITE_OTHER): Payer: 59

## 2017-10-14 ENCOUNTER — Encounter: Payer: Self-pay | Admitting: Internal Medicine

## 2017-10-14 VITALS — BP 144/90 | HR 86 | Temp 98.3°F | Resp 16 | Wt 321.0 lb

## 2017-10-14 DIAGNOSIS — E119 Type 2 diabetes mellitus without complications: Secondary | ICD-10-CM

## 2017-10-14 DIAGNOSIS — E7849 Other hyperlipidemia: Secondary | ICD-10-CM | POA: Diagnosis not present

## 2017-10-14 DIAGNOSIS — K219 Gastro-esophageal reflux disease without esophagitis: Secondary | ICD-10-CM

## 2017-10-14 DIAGNOSIS — F32A Depression, unspecified: Secondary | ICD-10-CM

## 2017-10-14 DIAGNOSIS — I10 Essential (primary) hypertension: Secondary | ICD-10-CM

## 2017-10-14 DIAGNOSIS — F329 Major depressive disorder, single episode, unspecified: Secondary | ICD-10-CM

## 2017-10-14 DIAGNOSIS — F419 Anxiety disorder, unspecified: Secondary | ICD-10-CM

## 2017-10-14 LAB — COMPREHENSIVE METABOLIC PANEL
ALBUMIN: 3.7 g/dL (ref 3.5–5.2)
ALK PHOS: 104 U/L (ref 39–117)
ALT: 13 U/L (ref 0–35)
AST: 14 U/L (ref 0–37)
BUN: 7 mg/dL (ref 6–23)
CO2: 28 mEq/L (ref 19–32)
CREATININE: 0.85 mg/dL (ref 0.40–1.20)
Calcium: 9 mg/dL (ref 8.4–10.5)
Chloride: 104 mEq/L (ref 96–112)
GFR: 94.55 mL/min (ref >60.00)
GLUCOSE: 113 mg/dL — AB (ref 70–99)
POTASSIUM: 4.1 meq/L (ref 3.5–5.1)
SODIUM: 137 meq/L (ref 135–145)
TOTAL PROTEIN: 7.6 g/dL (ref 6.0–8.3)
Total Bilirubin: 0.2 mg/dL (ref 0.2–1.2)

## 2017-10-14 LAB — HEMOGLOBIN A1C: HEMOGLOBIN A1C: 6.2 % (ref 4.6–6.5)

## 2017-10-14 LAB — MICROALBUMIN / CREATININE URINE RATIO
CREATININE, U: 236.5 mg/dL
MICROALB UR: 1.4 mg/dL (ref 0.0–1.9)
Microalb Creat Ratio: 0.6 mg/g (ref 0.0–30.0)

## 2017-10-14 LAB — LIPID PANEL
Cholesterol: 217 mg/dL — ABNORMAL HIGH (ref 0–200)
HDL: 40.8 mg/dL (ref >39.00)
LDL Cholesterol: 153 mg/dL — ABNORMAL HIGH (ref 0–99)
NonHDL: 176.31
Total CHOL/HDL Ratio: 5
Triglycerides: 116 mg/dL (ref 0.0–149.0)
VLDL: 23.2 mg/dL (ref 0.0–40.0)

## 2017-10-14 MED ORDER — METFORMIN HCL 500 MG PO TABS: 500.0000 mg | ORAL_TABLET | Freq: Two times a day (BID) | ORAL | 1 refills | Status: DC

## 2017-10-14 MED ORDER — ESCITALOPRAM OXALATE 10 MG PO TABS: 10.0000 mg | ORAL_TABLET | Freq: Every day | ORAL | 1 refills | Status: DC

## 2017-10-14 MED ORDER — AMLODIPINE BESYLATE 5 MG PO TABS: 5.0000 mg | ORAL_TABLET | Freq: Every day | ORAL | 1 refills | Status: DC

## 2017-10-14 MED ORDER — METOPROLOL TARTRATE 25 MG PO TABS: ORAL_TABLET | ORAL | 1 refills | Status: DC

## 2017-10-14 MED ORDER — DULAGLUTIDE 1.5 MG/0.5ML ~~LOC~~ SOAJ: 1.5000 mg | SUBCUTANEOUS | 1 refills | Status: DC

## 2017-10-14 NOTE — Assessment & Plan Note (Signed)
GERD controlled Continue daily medication  

## 2017-10-14 NOTE — Addendum Note (Signed)
Addended by: Terence Lux B on: 10/14/2017 08:25 AM   Modules accepted: Orders

## 2017-10-14 NOTE — Assessment & Plan Note (Signed)
Check a1c Low sugar / carb diet Stressed regular exercise, weight loss  

## 2017-10-14 NOTE — Assessment & Plan Note (Signed)
Controlled, stable Continue current dose of medication  

## 2017-10-14 NOTE — Patient Instructions (Signed)

## 2017-10-14 NOTE — Assessment & Plan Note (Signed)
BP high today but has been lower Asked her to start checking it Continue current medications Continue to work on weight loss cmp

## 2017-10-14 NOTE — Assessment & Plan Note (Signed)
Check lipid panel  Not on medication Regular exercise and healthy diet encouraged  

## 2017-10-19 ENCOUNTER — Other Ambulatory Visit: Payer: Self-pay | Admitting: Internal Medicine

## 2017-10-19 MED ORDER — ATORVASTATIN CALCIUM 20 MG PO TABS: 20.0000 mg | ORAL_TABLET | Freq: Every day | ORAL | 3 refills | Status: DC

## 2017-12-24 NOTE — Progress Notes (Signed)
Subjective:    Patient ID: Debra Le, female    DOB: April 20, 1975, 42 y.o.   MRN: [id number]  HPI The patient is here for an acute visit.   Knot of foot: She has a dark round spot on the medial aspect of her right dorsal foot.  It has been there about 2-3 months. It has gotten larger. It is sore.  It does not itch.  There was no trauma to the area.  If you push on it it is slightly tender.  Nausea: Yesterday she woke up nauseous for no obvious reason.  She continues to feel nauseous today.  There have been no changes in medications or supplements.  She is not eating anything out of the ordinary.  She denies any vomiting, heartburn or abdominal pain.  She denies any fevers, chills, diarrhea.  She has been able to eat a little and drink, but not as much as normal.  She denies any cold symptoms or flulike symptoms.  There is no chance of her being pregnant.   Medications and allergies reviewed with patient and updated if appropriate.  Patient Active Problem List   Diagnosis Date Noted  . Skin abnormality 12/25/2017  . Other hyperlipidemia 03/23/2017  . Esophageal pain 08/27/2016  . Endometriosis 06/13/2016  . Depression 06/13/2016  . Diabetes ([place]) 09/20/2015  . Keloid 09/19/2015  . Lump of skin 09/19/2015  . Migraines 09/19/2015  . Essential hypertension, benign 03/09/2015  . GERD (gastroesophageal reflux disease) 03/09/2015  . Anxiety 03/09/2015  . Vitamin D deficiency 03/09/2015  . Morbid obesity ([place]) 03/09/2015    Current Outpatient Medications on File Prior to Visit  Medication Sig Dispense Refill  . amLODipine (NORVASC) 5 MG tablet Take 1 tablet (5 mg total) by mouth daily. 90 tablet 1  . atorvastatin (LIPITOR) 20 MG tablet Take 1 tablet (20 mg total) by mouth daily. 90 tablet 3  . blood glucose meter kit and supplies KIT Dispense based on patient and insurance preference. Use up to four times daily as directed. 1 each 0  . Dulaglutide (TRULICITY) 1.5 IZ/1.2OF SOPN  Inject 1.5 mg into the skin once a week. 12 pen 1  . escitalopram (LEXAPRO) 10 MG tablet Take 1 tablet (10 mg total) by mouth daily. 90 tablet 1  . esomeprazole (NEXIUM) 40 MG capsule Take 1 capsule (40 mg total) by mouth daily. 90 capsule 0  . metFORMIN (GLUCOPHAGE) 500 MG tablet Take 1 tablet (500 mg total) by mouth 2 (two) times daily with a meal. 180 tablet 1  . metoprolol tartrate (LOPRESSOR) 25 MG tablet TAKE 1 TABLET TWICE A DAY 180 tablet 1  . norgestimate-ethinyl estradiol (ORTHO-CYCLEN,SPRINTEC,PREVIFEM) 0.25-35 MG-MCG tablet Take 1 tablet by mouth daily.     No current facility-administered medications on file prior to visit.     Past Medical History:  Diagnosis Date  . Anxiety   . Depression   . Diabetes mellitus without complication ([place])   . GERD (gastroesophageal reflux disease)   . Hypertension   . Obesity     Past Surgical History:  Procedure Laterality Date  . CESAREAN SECTION  02/21/2005   with Tubal ligation    Social History   Socioeconomic History  . Marital status: Married    Spouse name: Not on file  . Number of children: 2  . Years of education: Not on file  . Highest education level: Not on file  Occupational History  . Occupation: Therapist, sports  Social Needs  . Emergency planning/management officer  strain: Not on file  . Food insecurity:    Worry: Not on file    Inability: Not on file  . Transportation needs:    Medical: Not on file    Non-medical: Not on file  Tobacco Use  . Smoking status: Never Smoker  . Smokeless tobacco: Never Used  Substance and Sexual Activity  . Alcohol use: Yes    Alcohol/week: 7.0 standard drinks    Types: 7 Standard drinks or equivalent per week    Comment: red wine at night  . Drug use: No  . Sexual activity: Not on file  Lifestyle  . Physical activity:    Days per week: Not on file    Minutes per session: Not on file  . Stress: Not on file  Relationships  . Social connections:    Talks on phone: Not on file    Gets together: Not  on file    Attends religious service: Not on file    Active member of club or organization: Not on file    Attends meetings of clubs or organizations: Not on file    Relationship status: Not on file  Other Topics Concern  . 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 Diabetes Mother   . Diabetes Maternal Aunt        all maternal side  . Diabetes Sister        x 2  . Hypertension Maternal Aunt        all maternal side  . Lymphoma Father   . Heart attack Maternal Aunt   . Dementia Maternal Aunt     Review of Systems  Constitutional: Negative for chills and fever.  Respiratory: Negative for cough, shortness of breath and wheezing.   Gastrointestinal: Positive for nausea. Negative for abdominal pain, constipation, diarrhea and vomiting.       No gerd  Genitourinary: Negative for dysuria, frequency and hematuria.       Objective:   Vitals:   12/25/17 0916  BP: 134/80  Pulse: 74  Resp: 18  Temp: 98.4 F (36.9 C)  SpO2: 98%   BP Readings from Last 3 Encounters:  12/25/17 134/80  10/14/17 (!) 144/90  03/23/17 134/88   Wt Readings from Last 3 Encounters:  12/25/17 (!) 326 lb (147.9 kg)  10/14/17 (!) 321 lb (145.6 kg)  03/23/17 (!) 327 lb (148.3 kg)   Body mass index is 46.78 kg/m.   Physical Exam  Constitutional: She appears well-developed and well-nourished. No distress.  HENT:  Head: Normocephalic and atraumatic.  Abdominal: Soft. She exhibits no distension. There is no tenderness.  Neurological: No sensory deficit.  Skin: Skin is warm and dry. She is not diaphoretic.  Right dorsal medial foot with a round, well-defined raised, hyperpigmented lesion that looks like the top of a pencil, mildly tender to palpation, no fluctuance, minimal underlying induration, mobile without surrounding erythema or swelling           Assessment & Plan:    See Problem List for Assessment  and Plan of chronic medical problems.

## 2017-12-25 ENCOUNTER — Ambulatory Visit: Payer: 59 | Admitting: Internal Medicine

## 2017-12-25 ENCOUNTER — Encounter: Payer: Self-pay | Admitting: Internal Medicine

## 2017-12-25 VITALS — BP 134/80 | HR 74 | Temp 98.4°F | Resp 18 | Ht 70.0 in | Wt 326.0 lb

## 2017-12-25 DIAGNOSIS — L989 Disorder of the skin and subcutaneous tissue, unspecified: Secondary | ICD-10-CM

## 2017-12-25 DIAGNOSIS — Z23 Encounter for immunization: Secondary | ICD-10-CM | POA: Diagnosis not present

## 2017-12-25 DIAGNOSIS — R110 Nausea: Secondary | ICD-10-CM | POA: Diagnosis not present

## 2017-12-25 NOTE — Patient Instructions (Addendum)
  Flu immunization administered today.    Medications reviewed and updated.  Changes include :   none   A referral was ordered for podiatry.

## 2017-12-25 NOTE — Assessment & Plan Note (Signed)
Started yesterday morning No other symptoms Deferred antinausea medication She will continue to monitor her symptoms and the me know if they change Continue good hydration, bland diet as tolerated

## 2017-12-25 NOTE — Assessment & Plan Note (Signed)
Right dorsal aspect of foot No obvious cause Does not appear to be cancer or precancer Possible cyst or benign growth Will refer to podiatry for further evaluation and possible removal

## 2018-01-08 ENCOUNTER — Other Ambulatory Visit: Payer: Self-pay | Admitting: Podiatry

## 2018-01-08 ENCOUNTER — Ambulatory Visit: Payer: 59 | Admitting: Podiatry

## 2018-01-08 ENCOUNTER — Encounter: Payer: Self-pay | Admitting: Podiatry

## 2018-01-08 ENCOUNTER — Ambulatory Visit (INDEPENDENT_AMBULATORY_CARE_PROVIDER_SITE_OTHER): Payer: 59

## 2018-01-08 DIAGNOSIS — M6740 Ganglion, unspecified site: Secondary | ICD-10-CM

## 2018-01-08 DIAGNOSIS — M79671 Pain in right foot: Secondary | ICD-10-CM

## 2018-01-08 DIAGNOSIS — L819 Disorder of pigmentation, unspecified: Secondary | ICD-10-CM

## 2018-01-08 NOTE — Progress Notes (Signed)
Subjective:    Patient ID: Debra Le, female    DOB: 08-01-1975, 42 y.o.   MRN: [id number]  HPI 42 year old female presents the office today for concerns of a dark, raised spot on the right foot has been getting larger.  She states that it does not hurt all the time but it started become uncomfortable.  She denies any recent injury to the area.  She states that it was white in color, darker and is gotten somewhat bigger.  She is diabetic and last A1c was around 6.2.  She said no recent treatment for this.  She has no other concerns.   Review of Systems  All other systems reviewed and are negative.  Past Medical History:  Diagnosis Date  . Anxiety   . Depression   . Diabetes mellitus without complication ([employer])   . GERD (gastroesophageal reflux disease)   . Hypertension   . Obesity     Past Surgical History:  Procedure Laterality Date  . CESAREAN SECTION  02/21/2005   with Tubal ligation     Current Outpatient Medications:  .  amLODipine (NORVASC) 5 MG tablet, Take 1 tablet (5 mg total) by mouth daily., Disp: 90 tablet, Rfl: 1 .  atorvastatin (LIPITOR) 20 MG tablet, Take 1 tablet (20 mg total) by mouth daily., Disp: 90 tablet, Rfl: 3 .  blood glucose meter kit and supplies KIT, Dispense based on patient and insurance preference. Use up to four times daily as directed., Disp: 1 each, Rfl: 0 .  Dulaglutide (TRULICITY) 1.5 NA/3.5TD SOPN, Inject 1.5 mg into the skin once a week., Disp: 12 pen, Rfl: 1 .  escitalopram (LEXAPRO) 10 MG tablet, Take 1 tablet (10 mg total) by mouth daily., Disp: 90 tablet, Rfl: 1 .  esomeprazole (NEXIUM) 40 MG capsule, Take 1 capsule (40 mg total) by mouth daily., Disp: 90 capsule, Rfl: 0 .  metFORMIN (GLUCOPHAGE) 500 MG tablet, Take 1 tablet (500 mg total) by mouth 2 (two) times daily with a meal., Disp: 180 tablet, Rfl: 1 .  metoprolol tartrate (LOPRESSOR) 25 MG tablet, TAKE 1 TABLET TWICE A DAY, Disp: 180 tablet, Rfl: 1 .  norgestimate-ethinyl  estradiol (ORTHO-CYCLEN,SPRINTEC,PREVIFEM) 0.25-35 MG-MCG tablet, Take 1 tablet by mouth daily., Disp: , Rfl:   No Known Allergies       Objective:   Physical Exam  General: AAO x3, NAD  Dermatological: Annual basis hyperpigmented lesion on the dorsal medial aspect of the right midfoot measuring approximately 0.7 x 0.7 cm.  No tenderness palpation of the area that the area is uncomfortable she states.  There is no drainage or pus coming from the lesion.  No clinical signs of infection are noted.  No other lesions identified bilateral lower extremities.      Vascular: Dorsalis Pedis artery and Posterior Tibial artery pedal pulses are 2/4 bilateral with immedate capillary fill time.  There is no pain with calf compression, swelling, warmth, erythema.   Neruologic: Grossly intact via light touch bilateral.  Protective threshold with Semmes Wienstein monofilament intact to all pedal sites bilateral.   Musculoskeletal: No gross boney pedal deformities bilateral. No pain, crepitus, or limitation noted with foot and ankle range of motion bilateral. Muscular strength 5/5 in all groups tested bilateral.  Gait: Unassisted, Nonantalgic.      Assessment & Plan:  42 year old female right soft tissue mass, skin lesion -Treatment options discussed including all alternatives, risks, and complications -Etiology of symptoms were discussed -X-rays were obtained and reviewed with the patient.  No underlying calcifications are present there is no bony destruction. -At this time we discussed surgical excision, biopsy of the lesion.  She wishes to proceed we will plan on doing this about 2 weeks under local anesthesia in the office. -The incision placement as well as the postoperative course was discussed with the patient. I discussed risks of the surgery which include, but not limited to, infection, bleeding, pain, swelling, need for further surgery, delayed or nonhealing, painful or ugly scar, numbness  or sensation changes, over/under correction, recurrence, transfer lesions, further deformity,  DVT/PE, loss of toe/foot. Patient understands these risks and wishes to proceed with surgery. The surgical consent was reviewed with the patient all 3 pages were signed. No promises or guarantees were given to the outcome of the procedure. All questions were answered to the best of my ability. Before the surgery the patient was encouraged to call the office if there is any further questions. The surgery will be performed in the office under local anesthesia on an outpatient basis.  Trula Slade DPM

## 2018-01-10 DIAGNOSIS — L819 Disorder of pigmentation, unspecified: Secondary | ICD-10-CM | POA: Insufficient documentation

## 2018-01-22 ENCOUNTER — Other Ambulatory Visit: Payer: Self-pay | Admitting: Podiatry

## 2018-01-22 ENCOUNTER — Ambulatory Visit: Payer: 59 | Admitting: Podiatry

## 2018-01-22 ENCOUNTER — Telehealth: Payer: Self-pay | Admitting: *Deleted

## 2018-01-22 ENCOUNTER — Encounter: Payer: Self-pay | Admitting: Podiatry

## 2018-01-22 VITALS — BP 135/81 | HR 78 | Temp 97.4°F | Resp 16

## 2018-01-22 DIAGNOSIS — B079 Viral wart, unspecified: Secondary | ICD-10-CM | POA: Diagnosis not present

## 2018-01-22 DIAGNOSIS — L819 Disorder of pigmentation, unspecified: Secondary | ICD-10-CM | POA: Diagnosis not present

## 2018-01-22 MED ORDER — CEPHALEXIN 500 MG PO CAPS: 500.0000 mg | ORAL_CAPSULE | Freq: Three times a day (TID) | ORAL | 0 refills | Status: DC

## 2018-01-22 NOTE — Telephone Encounter (Signed)
01/21/2018  I called and spoke to [name] at [facility] regarding the authorization for Debra Le's in office surgery scheduled for 01/22/2018.  I informed her I had sent the request on 01/14/2018 and I still have not heard anything.  I informed her the surgery is at 11 am tomorrow.  She stated it was still pending but she would expedite it by putting it as high priority.  01/22/2018 I went [name]) website to check the status of the authorization request.  It was still pending.  I called [employer] and spoke to [name].  He informed it was still pending.  I stressed my concern that the patient was due here at 11 am for her surgery.   He spoke with his supervisor and they are sending the request to the case manager, [name].  He gave me her direct phone number, then he transferred me to her line.  I left her a message of all pertinent information and asked for a return call.  Debra Le returned my call and stated the surgery was approved.  Approval number is [id number].

## 2018-01-23 NOTE — Progress Notes (Signed)
Subjective: 42 year old female presents to the office today for surgical excision of a hyperpigmented skin lesion to the dorsal aspect of her right foot which is been getting larger.  She has no new concerns today.  She states her blood sugars been well controlled. Denies any systemic complaints such as fevers, chills, nausea, vomiting. No acute changes since last appointment, and no other complaints at this time.   Objective: AAO x3, NAD DP/PT pulses palpable bilaterally, CRT less than 3 seconds On the dorsal medial aspect of the right foot is a hyperpigmented skin lesion measuring about 3.7 x 0.7 cm.  There is no surrounding edema, erythema, drainage or pus.  No other skin lesions are present today. No open lesions or pre-ulcerative lesions.  No pain with calf compression, swelling, warmth, erythema  Assessment: 42 year old female with hyperpigmented skin lesion right foot  Plan: -All treatment options discussed with the patient including all alternatives, risks, complications.  -Again we discussed the procedure as well as alternatives, risks, complications.  This point given the delusions being larger do recommend excision of the biopsy the lesion and she agrees.  The surgical consent was reviewed.  A mixture of 3 cc of lidocaine and Marcaine plain was infiltrated in a regional block fashion after the skin was prepped with alcohol.  She was then brought back into the procedure suite.  A well-padded pneumatic ankle tourniquet was applied to the right ankle.  The right lower extremities and scrubbed, prepped, draped in normal sterile fashion.  A timeout was performed.  The right lower extremity was exsanguinated and the tourniquet was inflated to 250 mmHg.  2 semielliptical incisions were made overlying skin lesion.  Incision was made with a #15 blade scalpel to the epidermis and dermis removing a wedge of tissue in the skin lesion.  It did not appear that there is any extension of any  hyperpigmentation into the underlying tissue.  There is no signs of infection present.  The incision was irrigated.  The skin was then closed with 4-0 nylon.  Betadine was painted over the incision followed by dry sterile dressing.  The tourniquet was released and there was found to be an immediate cap refill time to all the digits.  She tolerated the procedure well with any complications. -Patient encouraged to call the office with any questions, concerns, change in symptoms.  -Keflex x 3 days   Trula Slade DPM

## 2018-01-26 LAB — PATHOLOGY

## 2018-01-26 LAB — TISSUE SPECIMEN

## 2018-01-27 ENCOUNTER — Telehealth: Payer: Self-pay | Admitting: *Deleted

## 2018-01-27 NOTE — Telephone Encounter (Signed)
Called and spoke with the patient and stated that the biopsy that we sent off came back as a wart per Dr Jacqualyn Posey and just wanted to call and let you know and that we would see patient on Friday 01/29/18 but if any concerns or questions to call the office at [phone]. Lattie Haw

## 2018-01-29 ENCOUNTER — Ambulatory Visit (INDEPENDENT_AMBULATORY_CARE_PROVIDER_SITE_OTHER): Payer: 59 | Admitting: Podiatry

## 2018-01-29 ENCOUNTER — Encounter: Payer: Self-pay | Admitting: Podiatry

## 2018-01-29 VITALS — BP 130/75 | HR 75 | Temp 98.3°F | Resp 16

## 2018-01-29 DIAGNOSIS — L819 Disorder of pigmentation, unspecified: Secondary | ICD-10-CM

## 2018-01-31 NOTE — Progress Notes (Signed)
Subjective: Debra Le is a 42 y.o. is seen today in office s/p right foot removal of soft tissue mass preformed on 01/22/2018. She is currently not having any pain she is actually gone back to wearing a regular shoe.  She keeps Intermedic ointment and a bandage over the incision.  Denies any systemic complaints such as fevers, chills, nausea, vomiting. No calf pain, chest pain, shortness of breath.   Objective: General: No acute distress, AAOx3  DP/PT pulses palpable 2/4, CRT < 3 sec to all digits.  Protective sensation intact. Motor function intact.  RIGHT foot: Incision is well coapted without any evidence of dehiscence and sutures are intact. There is no surrounding erythema, ascending cellulitis, fluctuance, crepitus, malodor, drainage/purulence. There is trace edema around the surgical site. There is no pain along the surgical site.  No other areas of tenderness to bilateral lower extremities.  No other open lesions or pre-ulcerative lesions.  No pain with calf compression, swelling, warmth, erythema.   Assessment and Plan:  Status post right foot soft tissue mass excision, doing well with no complications   -Treatment options discussed including all alternatives, risks, and complications -Reviewed biopsy results with her.  In the buttock ointment and a bandage was applied. -Ice/elevation -Monitor for any clinical signs or symptoms of infection and DVT/PE and directed to call the office immediately should any occur or go to the ER. -Follow-up in 1 week for suture removal or sooner if any problems arise. In the meantime, encouraged to call the office with any questions, concerns, change in symptoms.   Celesta Gentile, DPM

## 2018-02-05 ENCOUNTER — Ambulatory Visit (INDEPENDENT_AMBULATORY_CARE_PROVIDER_SITE_OTHER): Payer: Self-pay | Admitting: Podiatry

## 2018-02-05 DIAGNOSIS — B070 Plantar wart: Secondary | ICD-10-CM

## 2018-02-07 DIAGNOSIS — B070 Plantar wart: Secondary | ICD-10-CM | POA: Insufficient documentation

## 2018-02-07 NOTE — Progress Notes (Signed)
Subjective: Debra Le is a 42 y.o. is seen today in office s/p right foot removal of soft tissue mass preformed on 01/22/2018.  She states that she is doing well but she is ready to get the stitches taken out.  She is having no pain she is wearing a regular shoe.  She does keep antibiotic ointment and a bandage along the incision daily.  She has no other concerns and overall she feels well denies any fevers, chills, nausea, vomiting, chest pain, shortness of breath, calf pain.    Objective: General: No acute distress, AAOx3  DP/PT pulses palpable 2/4, CRT < 3 sec to all digits.  Protective sensation intact. Motor function intact.  RIGHT foot: Incision is well coapted without any evidence of dehiscence and sutures are intact.  Incision appears to be healing well.  There is no surrounding erythema, ascending cellulitis, fluctuance, crepitus, malodor, drainage/purulence. There is trace edema around the surgical site. There is no pain along the surgical site.  No other areas of tenderness to bilateral lower extremities.  No other open lesions or pre-ulcerative lesions.  No pain with calf compression, swelling, warmth, erythema.   Assessment and Plan:  Status post right foot soft tissue mass excision, doing well with no complications   -Treatment options discussed including all alternatives, risks, and complications -Sutures removed without complications.  Steri-Strips applied for reinforcement followed by antibiotic ointment and a bandage.  She will continue with this at home as well.  There is any issues to let me know however otherwise I will see her back as needed.  Celesta Gentile, DPM

## 2018-02-22 ENCOUNTER — Ambulatory Visit: Payer: Self-pay | Admitting: *Deleted

## 2018-02-22 NOTE — Telephone Encounter (Signed)
Pt calling requesting to be switched to a medication for weight loss that was previously discussed with Dr. Quay Burow during visit on 10/14/17. Pt does not recall the name of the medication. Pt states that she was originally prescribed Trulicity to help with NATF5D levels and was told that it could also help with weight loss. Pt states that the medication has been helping with her HgbA1C but not weight loss. Pt wants to know if she could try the medication previously discussed instead of Trulicity. Pt states she has continued to use Trulicity up until this point because she gets a 3 month supply through mail order and did not want to waist the medication. Pt can be contacted at [phone].  Reason for Disposition . Caller has URGENT medication question about med that PCP prescribed and triager unable to answer question  Answer Assessment - Initial Assessment Questions 1. SYMPTOMS: "Do you have any symptoms?" Pt denies any symptoms 2. SEVERITY: If symptoms are present, ask "Are they mild, moderate or severe?"     n/a  Protocols used: MEDICATION QUESTION CALL-A-AH

## 2018-02-23 ENCOUNTER — Telehealth: Payer: Self-pay | Admitting: Internal Medicine

## 2018-02-23 MED ORDER — PHENTERMINE-TOPIRAMATE ER 3.75-23 MG PO CP24: ORAL_CAPSULE | ORAL | 0 refills | Status: DC

## 2018-02-23 MED ORDER — SEMAGLUTIDE(0.25 OR 0.5MG/DOS) 2 MG/1.5ML ~~LOC~~ SOPN: 0.5000 mg | PEN_INJECTOR | SUBCUTANEOUS | 2 refills | Status: DC

## 2018-02-23 NOTE — Telephone Encounter (Signed)
Is this something you would like her to be seen for?

## 2018-02-23 NOTE — Telephone Encounter (Signed)
We can try switching to ozempic - same type of medication that has more weight loss associated with it -- if it is covered.     We can also try a weight loss medication such as:    Qsymia, contrave or belviq - she would need to see what is covered by her insurance.

## 2018-02-23 NOTE — Telephone Encounter (Signed)
Copied from [place] [id number]. Topic: Quick Communication - See Telephone Encounter >> Feb 23, 2018 12:18 PM Conception Chancy, NT wrote: CRM for notification. See Telephone encounter for: 02/23/18.  Patient has contacted her insurance company per [employer] and Ozempic 90 day supply can be sent to her mail order pharmacy as it is covered.   [facility] [place] [address] [place] [address] Phone: [phone] Fax: [phone]-  Qsymia 30 day supply is not covered but she will pay out of pocket for this at her local pharmacy.  [id number] [place], [place] - [address] [place] [address] Phone: [phone] Fax: [phone]    Patient would like a call back once this has been taken care of.

## 2018-02-23 NOTE — Telephone Encounter (Signed)
Pt aware of response below and will call us back after talking with insurance to let us know what is covered.

## 2018-04-08 ENCOUNTER — Telehealth: Payer: Self-pay

## 2018-04-08 NOTE — Telephone Encounter (Signed)
Copied from [place] [id number]. Topic: General - Inquiry >> Apr 08, 2018  7:40 AM Debra Le, NT wrote: Reason for CRM: patient is calling and would like to know when the last time she has had her b12 and iron levels checked. She states for the past month she has been feeling more tired. Please advise. >> Apr 08, 2018  8:11 AM Morphies, Isidoro Donning wrote: Please advise on dates. I wanted to confirm before I called her.

## 2018-04-08 NOTE — Telephone Encounter (Signed)
noted 

## 2018-04-08 NOTE — Telephone Encounter (Signed)
Spoke with pt and advised when last labs were drawn for B12 and iron. I let pt know that she does have an appointment coming on 04/23/18 and these issues can be addressed then. Would like to have B12 and iron labs checked at appointment. FYI

## 2018-04-22 NOTE — Progress Notes (Signed)
Subjective:    Patient ID: Debra Le, female    DOB: July 23, 1975, 43 y.o.   MRN: [id number]  HPI The patient is here for follow up.  Diabetes: She is taking her medication daily as prescribed. She is not compliant with a diabetic diet.  She is drinking energy drinks and Mountain Dew to help her stay awake to do her work.  She is not exercising regularly.    Hypertension: She is taking her medication daily. She is compliant with a low sodium diet.  She denies chest pain, palpitations, edema, shortness of breath and regular headaches. She is not exercising regularly.  She does not monitor her blood pressure at home-she can monitor it, has not been monitoring it.    GERD:  She is taking her medication daily as prescribed.  She denies any GERD symptoms and feels her GERD is well controlled.   Depression: She is taking her medication daily as prescribed. She denies any side effects from the medication. She feels her depression is well controlled and she is happy with her current dose of medication.   Anxiety: She is taking her medication daily as prescribed. She denies any side effects from the medication. She feels her anxiety is well controlled and she is happy with her current dose of medication.   Morbid obesity: She is not exercising regularly and has been gaining weight.  She is eating in the late afternoon and then again late at night and not during the day.  Discussed that she needs to eat throughout the day and not overeat at night.  She needs to restructure her eating habits and eat different foods.  She is not currently exercising.  Medications and allergies reviewed with patient and updated if appropriate.  Patient Active Problem List   Diagnosis Date Noted  . Iron deficiency 04/23/2018  . Verruca pedis 02/07/2018  . Hyperpigmented skin lesion 01/10/2018  . Skin abnormality 12/25/2017  . Nausea 12/25/2017  . Other hyperlipidemia 03/23/2017  . Esophageal pain 08/27/2016  .  Endometriosis 06/13/2016  . Depression 06/13/2016  . Diabetes ([place]) 09/20/2015  . Keloid 09/19/2015  . Lump of skin 09/19/2015  . Migraines 09/19/2015  . Essential hypertension, benign 03/09/2015  . GERD (gastroesophageal reflux disease) 03/09/2015  . Anxiety 03/09/2015  . Vitamin D deficiency 03/09/2015  . Morbid obesity ([place]) 03/09/2015    Current Outpatient Medications on File Prior to Visit  Medication Sig Dispense Refill  . ascorbic acid (VITAMIN C) 100 MG tablet Take 100 mg by mouth daily.    . blood glucose meter kit and supplies KIT Dispense based on patient and insurance preference. Use up to four times daily as directed. 1 each 0  . Cyanocobalamin (B-12 PO) Take by mouth.    . norgestimate-ethinyl estradiol (ORTHO-CYCLEN,SPRINTEC,PREVIFEM) 0.25-35 MG-MCG tablet Take 1 tablet by mouth daily.    Marland Kitchen VITAMIN D, CHOLECALCIFEROL, PO Take by mouth.    . vitamin E 100 UNIT capsule Take by mouth daily.     No current facility-administered medications on file prior to visit.     Past Medical History:  Diagnosis Date  . Anxiety   . Depression   . Diabetes mellitus without complication ([place])   . GERD (gastroesophageal reflux disease)   . Hypertension   . Obesity     Past Surgical History:  Procedure Laterality Date  . CESAREAN SECTION  02/21/2005   with Tubal ligation    Social History   Socioeconomic History  . Marital status:  Married    Spouse name: Not on file  . Number of children: 2  . Years of education: Not on file  . Highest education level: Not on file  Occupational History  . Occupation: Therapist, sports  Social Needs  . Financial resource strain: Not on file  . Food insecurity:    Worry: Not on file    Inability: Not on file  . Transportation needs:    Medical: Not on file    Non-medical: Not on file  Tobacco Use  . Smoking status: Never Smoker  . Smokeless tobacco: Never Used  Substance and Sexual Activity  . Alcohol use: Yes    Alcohol/week: 7.0 standard  drinks    Types: 7 Standard drinks or equivalent per week    Comment: red wine at night  . Drug use: No  . Sexual activity: Not on file  Lifestyle  . Physical activity:    Days per week: Not on file    Minutes per session: Not on file  . Stress: Not on file  Relationships  . Social connections:    Talks on phone: Not on file    Gets together: Not on file    Attends religious service: Not on file    Active member of club or organization: Not on file    Attends meetings of clubs or organizations: Not on file    Relationship status: Not on file  Other Topics Concern  . 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 Diabetes Mother   . Diabetes Maternal Aunt        all maternal side  . Diabetes Sister        x 2  . Hypertension Maternal Aunt        all maternal side  . Lymphoma Father   . Heart attack Maternal Aunt   . Dementia Maternal Aunt     Review of Systems  Constitutional: Negative for chills and fever.  Respiratory: Negative for cough, shortness of breath and wheezing.   Cardiovascular: Negative for chest pain, palpitations and leg swelling.  Neurological: Negative for light-headedness and headaches.       Objective:   Vitals:   04/23/18 1534  BP: (!) 158/94  Pulse: 78  Resp: 18  Temp: 98.3 F (36.8 C)  SpO2: 99%   BP Readings from Last 3 Encounters:  04/23/18 (!) 158/94  01/29/18 130/75  01/22/18 135/81   Wt Readings from Last 3 Encounters:  04/23/18 (!) 331 lb (150.1 kg)  12/25/17 (!) 326 lb (147.9 kg)  10/14/17 (!) 321 lb (145.6 kg)   Body mass index is 47.49 kg/m.   Physical Exam    Constitutional: Appears well-developed and well-nourished. No distress.  HENT:  Head: Normocephalic and atraumatic.  Neck: Neck supple. No tracheal deviation present. No thyromegaly present.  No cervical lymphadenopathy Cardiovascular: Normal rate, regular rhythm and normal  heart sounds.   No murmur heard. No carotid bruit .  No edema Pulmonary/Chest: Effort normal and breath sounds normal. No respiratory distress. No has no wheezes. No rales.  Skin: Skin is warm and dry. Not diaphoretic.  Psychiatric: Normal mood and affect. Behavior is normal.      Assessment & Plan:    See Problem List for Assessment and Plan of chronic medical problems.

## 2018-04-22 NOTE — Patient Instructions (Addendum)
  Tests ordered today. Your results will be released to [place] (or called to you) after review, usually within 72hours after test completion. If any changes need to be made, you will be notified at that same time.  All other Health Maintenance issues reviewed.   All recommended immunizations and age-appropriate screenings are up-to-date or discussed.  tdap and pneumonia immunizations administered today.   Medications reviewed and updated.  Changes include : none    Your prescription(s) have been submitted to your pharmacy. Please take as directed and contact our office if you believe you are having problem(s) with the medication(s).   Please followup in 6 months

## 2018-04-23 ENCOUNTER — Ambulatory Visit: Payer: 59 | Admitting: Internal Medicine

## 2018-04-23 ENCOUNTER — Other Ambulatory Visit (INDEPENDENT_AMBULATORY_CARE_PROVIDER_SITE_OTHER): Payer: 59

## 2018-04-23 ENCOUNTER — Encounter: Payer: Self-pay | Admitting: Internal Medicine

## 2018-04-23 VITALS — BP 158/94 | HR 78 | Temp 98.3°F | Resp 18 | Ht 70.0 in | Wt 331.0 lb

## 2018-04-23 DIAGNOSIS — E611 Iron deficiency: Secondary | ICD-10-CM

## 2018-04-23 DIAGNOSIS — K219 Gastro-esophageal reflux disease without esophagitis: Secondary | ICD-10-CM

## 2018-04-23 DIAGNOSIS — F419 Anxiety disorder, unspecified: Secondary | ICD-10-CM | POA: Diagnosis not present

## 2018-04-23 DIAGNOSIS — I10 Essential (primary) hypertension: Secondary | ICD-10-CM

## 2018-04-23 DIAGNOSIS — Z23 Encounter for immunization: Secondary | ICD-10-CM

## 2018-04-23 DIAGNOSIS — E119 Type 2 diabetes mellitus without complications: Secondary | ICD-10-CM

## 2018-04-23 DIAGNOSIS — E7849 Other hyperlipidemia: Secondary | ICD-10-CM

## 2018-04-23 DIAGNOSIS — E559 Vitamin D deficiency, unspecified: Secondary | ICD-10-CM

## 2018-04-23 DIAGNOSIS — D649 Anemia, unspecified: Secondary | ICD-10-CM | POA: Insufficient documentation

## 2018-04-23 LAB — IRON,TIBC AND FERRITIN PANEL
%SAT: 23 % (calc) (ref 16–45)
Ferritin: 20 ng/mL (ref 16–232)
Iron: 82 ug/dL (ref 40–190)
TIBC: 349 mcg/dL (calc) (ref 250–450)

## 2018-04-23 LAB — LIPID PANEL
CHOL/HDL RATIO: 3
Cholesterol: 141 mg/dL (ref 0–200)
HDL: 41.5 mg/dL (ref >39.00)
LDL Cholesterol: 77 mg/dL (ref 0–99)
NonHDL: 99.11
Triglycerides: 113 mg/dL (ref 0.0–149.0)
VLDL: 22.6 mg/dL (ref 0.0–40.0)

## 2018-04-23 LAB — COMPREHENSIVE METABOLIC PANEL
ALT: 15 U/L (ref 0–35)
AST: 16 U/L (ref 0–37)
Albumin: 3.9 g/dL (ref 3.5–5.2)
Alkaline Phosphatase: 122 U/L — ABNORMAL HIGH (ref 39–117)
BUN: 10 mg/dL (ref 6–23)
CO2: 26 mEq/L (ref 19–32)
Calcium: 9.5 mg/dL (ref 8.4–10.5)
Chloride: 103 mEq/L (ref 96–112)
Creatinine, Ser: 0.94 mg/dL (ref 0.40–1.20)
GFR: 79 mL/min (ref >60.00)
Glucose, Bld: 112 mg/dL — ABNORMAL HIGH (ref 70–99)
Potassium: 3.2 mEq/L — ABNORMAL LOW (ref 3.5–5.1)
Sodium: 138 mEq/L (ref 135–145)
Total Bilirubin: 0.3 mg/dL (ref 0.2–1.2)
Total Protein: 7.8 g/dL (ref 6.0–8.3)

## 2018-04-23 LAB — HEMOGLOBIN A1C: Hgb A1c MFr Bld: 6.5 % (ref 4.6–6.5)

## 2018-04-23 LAB — TSH: TSH: 1.69 u[IU]/mL (ref 0.35–4.50)

## 2018-04-23 MED ORDER — ATORVASTATIN CALCIUM 20 MG PO TABS: 20.0000 mg | ORAL_TABLET | Freq: Every day | ORAL | 3 refills | Status: DC

## 2018-04-23 MED ORDER — ESCITALOPRAM OXALATE 10 MG PO TABS: 10.0000 mg | ORAL_TABLET | Freq: Every day | ORAL | 1 refills | Status: DC

## 2018-04-23 MED ORDER — ESOMEPRAZOLE MAGNESIUM 40 MG PO CPDR: 40.0000 mg | DELAYED_RELEASE_CAPSULE | Freq: Every day | ORAL | 0 refills | Status: DC

## 2018-04-23 MED ORDER — METFORMIN HCL 500 MG PO TABS: 500.0000 mg | ORAL_TABLET | Freq: Two times a day (BID) | ORAL | 1 refills | Status: DC

## 2018-04-23 MED ORDER — METOPROLOL TARTRATE 25 MG PO TABS: ORAL_TABLET | ORAL | 1 refills | Status: DC

## 2018-04-23 MED ORDER — SEMAGLUTIDE(0.25 OR 0.5MG/DOS) 2 MG/1.5ML ~~LOC~~ SOPN: 0.5000 mg | PEN_INJECTOR | SUBCUTANEOUS | 2 refills | Status: DC

## 2018-04-23 MED ORDER — AMLODIPINE BESYLATE 5 MG PO TABS: 5.0000 mg | ORAL_TABLET | Freq: Every day | ORAL | 1 refills | Status: DC

## 2018-04-23 NOTE — Assessment & Plan Note (Signed)
With hypertension, diabetes Not exercising-we will try to start going to the gym again Not eating during the day and eating a meal in the late afternoon and late at night-discussed restructuring her eating and ways to do that Follow-up in 6 months sooner if needed

## 2018-04-23 NOTE — Assessment & Plan Note (Signed)
History of iron deficiency Got iron for a while and just restarted due to fatigue Check iron levels

## 2018-04-23 NOTE — Assessment & Plan Note (Signed)
Controlled, stable Continue current dose of medication  

## 2018-04-23 NOTE — Assessment & Plan Note (Signed)
Drinking [employer] and energy drinks to be able to do her work Stressed low [employer] regular exercise Work on [employer] loss Check A1c We will adjust medication if needed Follow-up in 6 months-sooner depending on A1c

## 2018-04-23 NOTE — Assessment & Plan Note (Signed)
Stop vitamin D for a while, but just restarted it due to fatigue Check level

## 2018-04-23 NOTE — Assessment & Plan Note (Signed)
Check lipid panel  Continue daily statin Regular exercise and healthy diet encouraged  

## 2018-04-23 NOTE — Assessment & Plan Note (Signed)
GERD controlled Continue daily medication  

## 2018-04-23 NOTE — Assessment & Plan Note (Signed)
Blood pressure elevated here today, but the last 2 times she was here it was good She will start monitoring at home more consistently Low-sodium diet Start regular exercise Work on weight loss No change in medications CMP

## 2018-04-26 ENCOUNTER — Telehealth: Payer: Self-pay | Admitting: *Deleted

## 2018-04-26 LAB — VITAMIN D 25 HYDROXY (VIT D DEFICIENCY, FRACTURES): VITD: 37.31 ng/mL (ref 30.00–100.00)

## 2018-04-26 MED ORDER — SEMAGLUTIDE(0.25 OR 0.5MG/DOS) 2 MG/1.5ML ~~LOC~~ SOPN: 0.5000 mg | PEN_INJECTOR | SUBCUTANEOUS | 1 refills | Status: DC

## 2018-04-26 NOTE — Telephone Encounter (Signed)
Patient called to get her lab results- while on line she has requested that her Ozempic Rx be changed to a 3 month supply through her mail order- it is the same price as a month supply. Will send request.

## 2018-04-26 NOTE — Telephone Encounter (Signed)
Rx sent for 3 month supply through mail order.

## 2018-04-26 NOTE — Addendum Note (Signed)
Addended by: Delice Bison E on: 04/26/2018 11:47 AM   Modules accepted: Orders

## 2018-08-27 LAB — HM MAMMOGRAPHY

## 2018-10-22 ENCOUNTER — Ambulatory Visit: Payer: 59 | Admitting: Internal Medicine

## 2018-10-25 ENCOUNTER — Ambulatory Visit: Payer: 59 | Admitting: Internal Medicine

## 2018-10-28 NOTE — Patient Instructions (Addendum)
  Tests ordered today. Your results will be released to [place] (or called to you) after review.  If any changes need to be made, you will be notified at that same time.   Medications reviewed and updated.  Changes include :   Increase metoprolol to 50 mg twice daily.  Increase ozempic to 1 mg.  Your prescription(s) have been submitted to your pharmacy. Please take as directed and contact our office if you believe you are having problem(s) with the medication(s).    Please followup in 6 months

## 2018-10-28 NOTE — Progress Notes (Signed)
Subjective:    Patient ID: Debra Le, female    DOB: 10/14/1975, 43 y.o.   MRN: [id number]  HPI The patient is here for follow up.  She is exercising regularly - walking.     Right ear pain:  It started three days ago.  It started in her neck and went to her ear.  She throat was dry.  She was concerned that there is an ear infection.  She does wear ear buds and has only been wearing the one in her right ear and she thought that could be a potential cause as well.  Diabetes: she ran out of ozempic for a couple of weeks last month, but she is taking her medication daily as prescribed. She is not always compliant with a diabetic diet.  Her sugar was 122 the last times she checked.   She denies numbness/tingling in her feet and foot lesions. She is up-to-date with an ophthalmology examination and has one scheduled for next week.    Hypertension: She is taking her medication daily. She is compliant with a low sodium diet.  She denies chest pain, palpitations, shortness of breath and regular headaches. She does not monitor her blood pressure at home.    GERD:  She is taking her medication daily as prescribed.  She denies any GERD symptoms and feels her GERD is well controlled.   Depression: She is taking her medication daily as prescribed. She denies any side effects from the medication. She feels her depression is well controlled and she is happy with her current dose of medication.   Anxiety: She is taking her medication daily as prescribed. She denies any side effects from the medication. She feels her anxiety is well controlled and she is happy with her current dose of medication.   Obesity: She is walking for exercise.  She feels she is eating fairly well, except she does not always eat when she should and often overeats.  She knows she needs to work on that.   Medications and allergies reviewed with patient and updated if appropriate.  Patient Active Problem List   Diagnosis Date  Noted  . Iron deficiency 04/23/2018  . Verruca pedis 02/07/2018  . Hyperpigmented skin lesion 01/10/2018  . Skin abnormality 12/25/2017  . Other hyperlipidemia 03/23/2017  . Endometriosis 06/13/2016  . Depression 06/13/2016  . Diabetes ([place]) 09/20/2015  . Keloid 09/19/2015  . Lump of skin 09/19/2015  . Migraines 09/19/2015  . Essential hypertension, benign 03/09/2015  . GERD (gastroesophageal reflux disease) 03/09/2015  . Anxiety 03/09/2015  . Vitamin D deficiency 03/09/2015  . Morbid obesity ([place]) 03/09/2015    Current Outpatient Medications on File Prior to Visit  Medication Sig Dispense Refill  . ascorbic acid (VITAMIN C) 100 MG tablet Take 100 mg by mouth daily.    Marland Kitchen atorvastatin (LIPITOR) 20 MG tablet Take 1 tablet (20 mg total) by mouth daily. 90 tablet 3  . blood glucose meter kit and supplies KIT Dispense based on patient and insurance preference. Use up to four times daily as directed. 1 each 0  . Cyanocobalamin (B-12 PO) Take by mouth.    Marland Kitchen VITAMIN D, CHOLECALCIFEROL, PO Take by mouth.    . vitamin E 100 UNIT capsule Take by mouth daily.     No current facility-administered medications on file prior to visit.     Past Medical History:  Diagnosis Date  . Anxiety   . Depression   . Diabetes mellitus without complication ([place])   .  GERD (gastroesophageal reflux disease)   . Hypertension   . Obesity     Past Surgical History:  Procedure Laterality Date  . CESAREAN SECTION  02/21/2005   with Tubal ligation    Social History   Socioeconomic History  . Marital status: Married    Spouse name: Not on file  . Number of children: 2  . Years of education: Not on file  . Highest education level: Not on file  Occupational History  . Occupation: Therapist, sports  Social Needs  . Financial resource strain: Not on file  . Food insecurity    Worry: Not on file    Inability: Not on file  . Transportation needs    Medical: Not on file    Non-medical: Not on file  Tobacco Use   . Smoking status: Never Smoker  . Smokeless tobacco: Never Used  Substance and Sexual Activity  . Alcohol use: Yes    Alcohol/week: 7.0 standard drinks    Types: 7 Standard drinks or equivalent per week    Comment: red wine at night  . Drug use: No  . Sexual activity: Not on file  Lifestyle  . Physical activity    Days per week: Not on file    Minutes per session: Not on file  . Stress: Not on file  Relationships  . Social Herbalist on phone: Not on file    Gets together: Not on file    Attends religious service: Not on file    Active member of club or organization: Not on file    Attends meetings of clubs or organizations: Not on file    Relationship status: Not on file  Other Topics Concern  . 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 Diabetes Mother   . Diabetes Maternal Aunt        all maternal side  . Diabetes Sister        x 2  . Hypertension Maternal Aunt        all maternal side  . Lymphoma Father   . Heart attack Maternal Aunt   . Dementia Maternal Aunt     Review of Systems  Constitutional: Negative for chills and fever.  HENT: Positive for ear pain. Negative for congestion, sinus pain and sore throat.   Respiratory: Negative for cough, shortness of breath and wheezing.   Cardiovascular: Positive for leg swelling (mild at end of day). Negative for chest pain and palpitations.  Musculoskeletal: Negative for myalgias.  Neurological: Negative for light-headedness and headaches.       Objective:   Vitals:   10/29/18 0742  BP: (!) 140/96  Pulse: 80  Resp: 16  Temp: 98.6 F (37 C)  SpO2: 97%   BP Readings from Last 3 Encounters:  10/29/18 (!) 140/96  04/23/18 (!) 158/94  01/29/18 130/75   Wt Readings from Last 3 Encounters:  10/29/18 (!) 332 lb 12.8 oz (151 kg)  04/23/18 (!) 331 lb (150.1 kg)  12/25/17 (!) 326 lb (147.9 kg)   Body mass index is  47.75 kg/m.   Physical Exam    Constitutional: Appears well-developed and well-nourished. No distress.  HENT:  Head: Normocephalic and atraumatic.  Neck: Neck supple. No tracheal deviation present. No thyromegaly present.  No cervical lymphadenopathy Cardiovascular: Normal rate, regular rhythm and normal heart sounds.   No murmur heard. No  carotid bruit .  No edema Pulmonary/Chest: Effort normal and breath sounds normal. No respiratory distress. No has no wheezes. No rales.  Skin: Skin is warm and dry. Not diaphoretic.  Psychiatric: Normal mood and affect. Behavior is normal.    Diabetic Foot Exam - Simple   Simple Foot Form Diabetic Foot exam was performed with the following findings: Yes 10/29/2018  8:06 AM  Visual Inspection No deformities, no ulcerations, no other skin breakdown bilaterally: Yes Sensation Testing Intact to touch and monofilament testing bilaterally: Yes Pulse Check Posterior Tibialis and Dorsalis pulse intact bilaterally: Yes Comments       Assessment & Plan:    See Problem List for Assessment and Plan of chronic medical problems.

## 2018-10-29 ENCOUNTER — Encounter: Payer: Self-pay | Admitting: Internal Medicine

## 2018-10-29 ENCOUNTER — Ambulatory Visit (INDEPENDENT_AMBULATORY_CARE_PROVIDER_SITE_OTHER): Payer: 59 | Admitting: Internal Medicine

## 2018-10-29 ENCOUNTER — Other Ambulatory Visit: Payer: Self-pay

## 2018-10-29 ENCOUNTER — Other Ambulatory Visit (INDEPENDENT_AMBULATORY_CARE_PROVIDER_SITE_OTHER): Payer: 59

## 2018-10-29 VITALS — BP 140/96 | HR 80 | Temp 98.6°F | Resp 16 | Ht 70.0 in | Wt 332.8 lb

## 2018-10-29 DIAGNOSIS — E119 Type 2 diabetes mellitus without complications: Secondary | ICD-10-CM

## 2018-10-29 DIAGNOSIS — I10 Essential (primary) hypertension: Secondary | ICD-10-CM

## 2018-10-29 DIAGNOSIS — F32A Depression, unspecified: Secondary | ICD-10-CM

## 2018-10-29 DIAGNOSIS — F419 Anxiety disorder, unspecified: Secondary | ICD-10-CM

## 2018-10-29 DIAGNOSIS — K219 Gastro-esophageal reflux disease without esophagitis: Secondary | ICD-10-CM

## 2018-10-29 DIAGNOSIS — E7849 Other hyperlipidemia: Secondary | ICD-10-CM | POA: Diagnosis not present

## 2018-10-29 DIAGNOSIS — F329 Major depressive disorder, single episode, unspecified: Secondary | ICD-10-CM

## 2018-10-29 LAB — LIPID PANEL
Cholesterol: 148 mg/dL (ref 0–200)
HDL: 32.1 mg/dL — ABNORMAL LOW (ref >39.00)
LDL Cholesterol: 97 mg/dL (ref 0–99)
NonHDL: 115.79
Total CHOL/HDL Ratio: 5
Triglycerides: 94 mg/dL (ref 0.0–149.0)
VLDL: 18.8 mg/dL (ref 0.0–40.0)

## 2018-10-29 LAB — COMPREHENSIVE METABOLIC PANEL
ALT: 23 U/L (ref 0–35)
AST: 21 U/L (ref 0–37)
Albumin: 4.1 g/dL (ref 3.5–5.2)
Alkaline Phosphatase: 136 U/L — ABNORMAL HIGH (ref 39–117)
BUN: 12 mg/dL (ref 6–23)
CO2: 26 mEq/L (ref 19–32)
Calcium: 10 mg/dL (ref 8.4–10.5)
Chloride: 103 mEq/L (ref 96–112)
Creatinine, Ser: 0.91 mg/dL (ref 0.40–1.20)
GFR: 81.81 mL/min (ref >60.00)
Glucose, Bld: 116 mg/dL — ABNORMAL HIGH (ref 70–99)
Potassium: 4.1 mEq/L (ref 3.5–5.1)
Sodium: 139 mEq/L (ref 135–145)
Total Bilirubin: 0.4 mg/dL (ref 0.2–1.2)
Total Protein: 7.9 g/dL (ref 6.0–8.3)

## 2018-10-29 LAB — MICROALBUMIN / CREATININE URINE RATIO
Creatinine,U: 278.9 mg/dL
Microalb Creat Ratio: 0.6 mg/g (ref 0.0–30.0)
Microalb, Ur: 1.7 mg/dL (ref 0.0–1.9)

## 2018-10-29 LAB — HEMOGLOBIN A1C: Hgb A1c MFr Bld: 6.8 % — ABNORMAL HIGH (ref 4.6–6.5)

## 2018-10-29 MED ORDER — ESOMEPRAZOLE MAGNESIUM 40 MG PO CPDR: 40.0000 mg | DELAYED_RELEASE_CAPSULE | Freq: Every day | ORAL | 0 refills | Status: DC

## 2018-10-29 MED ORDER — METFORMIN HCL 500 MG PO TABS: 500.0000 mg | ORAL_TABLET | Freq: Two times a day (BID) | ORAL | 1 refills | Status: DC

## 2018-10-29 MED ORDER — METOPROLOL TARTRATE 50 MG PO TABS: 50.0000 mg | ORAL_TABLET | Freq: Two times a day (BID) | ORAL | 1 refills | Status: DC

## 2018-10-29 MED ORDER — AMLODIPINE BESYLATE 5 MG PO TABS: 5.0000 mg | ORAL_TABLET | Freq: Every day | ORAL | 1 refills | Status: DC

## 2018-10-29 MED ORDER — ESCITALOPRAM OXALATE 10 MG PO TABS: 10.0000 mg | ORAL_TABLET | Freq: Every day | ORAL | 1 refills | Status: DC

## 2018-10-29 MED ORDER — OZEMPIC (1 MG/DOSE) 2 MG/1.5ML ~~LOC~~ SOPN: 1.0000 mg | PEN_INJECTOR | SUBCUTANEOUS | 1 refills | Status: DC

## 2018-10-29 NOTE — Assessment & Plan Note (Signed)
Controlled, stable Continue current dose of medication  

## 2018-10-29 NOTE — Assessment & Plan Note (Signed)
Check lipid panel  Continue daily statin Regular exercise and healthy diet encouraged  

## 2018-10-29 NOTE — Assessment & Plan Note (Signed)
Stressed weight loss Will increase Ozempic to 1 mg weekly to see if that helps Continue regular walking-increase if possible Stressed decreasing portions is the most important thing for weight loss-discussed need to restructure her eating-she cannot go hours without eating and then binge

## 2018-10-29 NOTE — Assessment & Plan Note (Signed)
Not controlled Increase metoprolol to 50 mg twice daily Continue amlodipine 5 mg daily cmp Work on weight loss

## 2018-10-29 NOTE — Assessment & Plan Note (Addendum)
A1c, urine micro Sugars have been controlled We will go ahead and increase the Ozempic to 1 mg weekly, which will hopefully help with weight loss Continue metformin at current dose Advised her to have her eye exam report sent Follow-up in 6 months

## 2018-10-29 NOTE — Assessment & Plan Note (Signed)
GERD controlled Continue daily medication  

## 2018-12-02 ENCOUNTER — Other Ambulatory Visit: Payer: Self-pay | Admitting: Internal Medicine

## 2018-12-02 MED ORDER — ESCITALOPRAM OXALATE 10 MG PO TABS: 10.0000 mg | ORAL_TABLET | Freq: Every day | ORAL | 1 refills | Status: DC

## 2018-12-02 NOTE — Telephone Encounter (Signed)
Medication Refill - Medication: buPROPion (WELLBUTRIN XL) 300 MG 24 hr tablet +  escitalopram (LEXAPRO) 10 MG tablet [id number]  Pt is requesting to increase Lexapro to 15 mgs and restart Wellbutrin, please advise.  Best contact: [phone]   Has the patient contacted their pharmacy? Yes.   (Agent: If no, request that the patient contact the pharmacy for the refill.) (Agent: If yes, when and what did the pharmacy advise?)  Preferred Pharmacy (with phone number or street name):  [facility] [place] [facility] [address] [place] [address]  Phone: [phone] Fax: [phone]     Agent: Please be advised that RX refills may take up to 3 business days. We ask that you follow-up with your pharmacy.

## 2019-01-27 ENCOUNTER — Other Ambulatory Visit: Payer: Self-pay

## 2019-01-27 ENCOUNTER — Ambulatory Visit (INDEPENDENT_AMBULATORY_CARE_PROVIDER_SITE_OTHER): Payer: 59

## 2019-01-27 ENCOUNTER — Encounter: Payer: Self-pay | Admitting: Internal Medicine

## 2019-01-27 ENCOUNTER — Encounter: Payer: Self-pay | Admitting: Emergency Medicine

## 2019-01-27 DIAGNOSIS — Z23 Encounter for immunization: Secondary | ICD-10-CM

## 2019-02-02 ENCOUNTER — Telehealth: Payer: Self-pay | Admitting: Internal Medicine

## 2019-02-02 NOTE — Telephone Encounter (Signed)
Pt is calling and would like to increase escitalopram 10 mg to 15 mg once a day. [facility]. Pt needs a 90 day supply. Pt is almost out and would like callback

## 2019-02-02 NOTE — Telephone Encounter (Signed)
Appointment scheduled for 7:45 tomorrow.

## 2019-02-02 NOTE — Progress Notes (Signed)
Virtual Visit via Video Note  I connected with Debra Le on 02/03/19 at  7:45 AM EDT by a video enabled telemedicine application and verified that I am speaking with the correct person using two identifiers.   I discussed the limitations of evaluation and management by telemedicine and the availability of in person appointments. The patient expressed understanding and agreed to proceed.  The patient is currently at home and I am in the office.    No referring provider.    History of Present Illness: This is an acute visit for depression   Depression, anxiety: She is taking her lexapro daily as prescribed. She denies any side effects from the medication. She feels her depression and anxiety are not controlled and she would like to increase the dose of her medication.  She has more depression than anxiety.  She has had decreased motivation and decreased energy since August, which was the anniversary of her sister's death.  She has not been able to shake it and feels like she needs a medication adjustment to help.  She is a little more anxious.  She is behind at work and can not get motivated to catch up.  She is interested in increasing the lexapro and adding wellbutrin back.  This combination has worked well for her in the past.  She denies thoughts of hurting herself.       Social History   Socioeconomic History  . Marital status: Married    Spouse name: Not on file  . Number of children: 2  . Years of education: Not on file  . Highest education level: Not on file  Occupational History  . Occupation: Therapist, sports  Social Needs  . Financial resource strain: Not on file  . Food insecurity    Worry: Not on file    Inability: Not on file  . Transportation needs    Medical: Not on file    Non-medical: Not on file  Tobacco Use  . Smoking status: Never Smoker  . Smokeless tobacco: Never Used  Substance and Sexual Activity  . Alcohol use: Yes    Alcohol/week: 7.0 standard drinks   Types: 7 Standard drinks or equivalent per week    Comment: red wine at night  . Drug use: No  . Sexual activity: Not on file  Lifestyle  . Physical activity    Days per week: Not on file    Minutes per session: Not on file  . Stress: Not on file  Relationships  . Social Herbalist on phone: Not on file    Gets together: Not on file    Attends religious service: Not on file    Active member of club or organization: Not on file    Attends meetings of clubs or organizations: Not on file    Relationship status: Not on file  Other Topics Concern  . 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  Normal thought process and judgement.  Assessment and Plan:  See Problem List for Assessment and Plan of chronic medical problems.   Follow Up Instructions:    I discussed the assessment and treatment plan with the patient. The patient was provided an opportunity to ask questions and all were answered. The patient agreed with the plan and demonstrated an understanding of the instructions.   The patient was advised to call  back or seek an in-person evaluation if the symptoms worsen or if the condition fails to improve as anticipated.  FU in Jan as scheduled, sooner if needed  Binnie Rail, MD

## 2019-02-02 NOTE — Telephone Encounter (Signed)
Yes, it should be a ov - can be virtual

## 2019-02-02 NOTE — Telephone Encounter (Signed)
OV

## 2019-02-03 ENCOUNTER — Ambulatory Visit (INDEPENDENT_AMBULATORY_CARE_PROVIDER_SITE_OTHER): Payer: 59 | Admitting: Internal Medicine

## 2019-02-03 ENCOUNTER — Encounter: Payer: Self-pay | Admitting: Internal Medicine

## 2019-02-03 DIAGNOSIS — F329 Major depressive disorder, single episode, unspecified: Secondary | ICD-10-CM

## 2019-02-03 DIAGNOSIS — F419 Anxiety disorder, unspecified: Secondary | ICD-10-CM | POA: Diagnosis not present

## 2019-02-03 DIAGNOSIS — F32A Depression, unspecified: Secondary | ICD-10-CM

## 2019-02-03 MED ORDER — ESCITALOPRAM OXALATE 10 MG PO TABS: 15.0000 mg | ORAL_TABLET | Freq: Every day | ORAL | 1 refills | Status: DC

## 2019-02-03 MED ORDER — BUPROPION HCL ER (SR) 100 MG PO TB12: 100.0000 mg | ORAL_TABLET | Freq: Two times a day (BID) | ORAL | 1 refills | Status: DC

## 2019-02-03 NOTE — Assessment & Plan Note (Signed)
Not ideally controlled Increase lexapro to 15 mg daily She will let me know if this does not help

## 2019-02-03 NOTE — Assessment & Plan Note (Signed)
Increased depression for a couple of months with dec motivation and energy Related to anniversary of sister's death Discussed medication options Will increase lexapro to 15 mg daily Add wellbutrin SR 100 mg 1-2 times daily - she will see what works best She will let me know if we need to adjust via phone F/u in Jan as scheduled.

## 2019-02-15 ENCOUNTER — Other Ambulatory Visit: Payer: Self-pay | Admitting: Internal Medicine

## 2019-02-23 ENCOUNTER — Telehealth: Payer: Self-pay | Admitting: Internal Medicine

## 2019-02-23 DIAGNOSIS — Z0184 Encounter for antibody response examination: Secondary | ICD-10-CM

## 2019-02-23 NOTE — Telephone Encounter (Signed)
Patient would like to schedule a Nurse visit to get the varicella vaccine. Is this okay to schedule?

## 2019-02-23 NOTE — Telephone Encounter (Signed)
I'm assuming her had titers done at work and is not immune?  If so ok to schedule.  Will need #2 injection 4 weeks later

## 2019-02-23 NOTE — Telephone Encounter (Signed)
Patient states she does not believe that she has had the titers done. Is this something that you can order for her? She is getting ready to start a new job and they are requiring that she has the varicella vaccine. She thought that she may have had it but we did not have record of it in our system or in the national system.

## 2019-02-23 NOTE — Telephone Encounter (Signed)
Will you check this information please when scheduling this appointment. THANK YOU!

## 2019-02-24 NOTE — Telephone Encounter (Signed)
She should come in for blood work - if she has immunity no vaccine is needed.   The test has been ordered

## 2019-02-24 NOTE — Telephone Encounter (Signed)
Patient called in to sch Titers shot and varicella vaccine. Not sure which should be sch first . Please advise and call patient back

## 2019-02-25 ENCOUNTER — Encounter: Payer: Self-pay | Admitting: Internal Medicine

## 2019-02-25 NOTE — Telephone Encounter (Signed)
Pt informed of below. She is going to check with the HD first and if she has had the varicella vaccine previously. If so, she will let us know and will not need titer or anything further.

## 2019-03-02 ENCOUNTER — Ambulatory Visit: Payer: 59

## 2019-04-08 DIAGNOSIS — U071 COVID-19: Secondary | ICD-10-CM

## 2019-04-08 HISTORY — DX: COVID-19: U07.1

## 2019-04-11 LAB — HM DIABETES EYE EXAM

## 2019-05-05 NOTE — Patient Instructions (Signed)
  Blood work was ordered.     Medications reviewed and updated.  Changes include :     Your prescription(s) have been submitted to your pharmacy. Please take as directed and contact our office if you believe you are having problem(s) with the medication(s).  A referral was ordered for    Please followup in 6 months   

## 2019-05-05 NOTE — Progress Notes (Signed)
Subjective:    Patient ID: Debra Le, female    DOB: Nov 07, 1975, 44 y.o.   MRN: [id number]  HPI     Medications and allergies reviewed with patient and updated if appropriate.  Patient Active Problem List   Diagnosis Date Noted  . Iron deficiency 04/23/2018  . Verruca pedis 02/07/2018  . Hyperpigmented skin lesion 01/10/2018  . Skin abnormality 12/25/2017  . Other hyperlipidemia 03/23/2017  . Endometriosis 06/13/2016  . Depression 06/13/2016  . Diabetes ([place]) 09/20/2015  . Keloid 09/19/2015  . Lump of skin 09/19/2015  . Migraines 09/19/2015  . Essential hypertension, benign 03/09/2015  . GERD (gastroesophageal reflux disease) 03/09/2015  . Anxiety 03/09/2015  . Vitamin D deficiency 03/09/2015  . Morbid obesity ([place]) 03/09/2015    Current Outpatient Medications on File Prior to Visit  Medication Sig Dispense Refill  . amLODipine (NORVASC) 5 MG tablet Take 1 tablet (5 mg total) by mouth daily. 90 tablet 1  . ascorbic acid (VITAMIN C) 100 MG tablet Take 100 mg by mouth daily.    Marland Kitchen atorvastatin (LIPITOR) 20 MG tablet Take 1 tablet (20 mg total) by mouth daily. 90 tablet 3  . blood glucose meter kit and supplies KIT Dispense based on patient and insurance preference. Use up to four times daily as directed. 1 each 0  . buPROPion (WELLBUTRIN SR) 100 MG 12 hr tablet Take 1 tablet (100 mg total) by mouth 2 (two) times daily. 180 tablet 1  . Cyanocobalamin (B-12 PO) Take by mouth.    . escitalopram (LEXAPRO) 10 MG tablet Take 1.5 tablets (15 mg total) by mouth daily. 135 tablet 1  . esomeprazole (NEXIUM) 40 MG capsule TAKE 1 CAPSULE DAILY 90 capsule 0  . metFORMIN (GLUCOPHAGE) 500 MG tablet Take 1 tablet (500 mg total) by mouth 2 (two) times daily with a meal. 180 tablet 1  . metoprolol tartrate (LOPRESSOR) 50 MG tablet Take 1 tablet (50 mg total) by mouth 2 (two) times daily. 180 tablet 1  . Semaglutide, 1 MG/DOSE, (OZEMPIC, 1 MG/DOSE,) 2 MG/1.5ML SOPN Inject 1 mg into the  skin once a week. 3 pen 1  . VITAMIN D, CHOLECALCIFEROL, PO Take by mouth.    . vitamin E 100 UNIT capsule Take by mouth daily.     No current facility-administered medications on file prior to visit.    Past Medical History:  Diagnosis Date  . Anxiety   . Depression   . Diabetes mellitus without complication ([place])   . GERD (gastroesophageal reflux disease)   . Hypertension   . Obesity     Past Surgical History:  Procedure Laterality Date  . CESAREAN SECTION  02/21/2005   with Tubal ligation    Social History   Socioeconomic History  . Marital status: Married    Spouse name: Not on file  . Number of children: 2  . Years of education: Not on file  . Highest education level: Not on file  Occupational History  . Occupation: Therapist, sports  Tobacco Use  . Smoking status: Never Smoker  . Smokeless tobacco: Never Used  Substance and Sexual Activity  . Alcohol use: Yes    Alcohol/week: 7.0 standard drinks    Types: 7 Standard drinks or equivalent per week    Comment: red wine at night  . Drug use: No  . Sexual activity: Not on file  Other Topics Concern  . Not on file  Social History Narrative   Psychiatric NP with [employer]  Social Determinants of Health   Financial Resource Strain:   . Difficulty of Paying Living Expenses: Not on file  Food Insecurity:   . Worried About Charity fundraiser in the Last Year: Not on file  . Ran Out of Food in the Last Year: Not on file  Transportation Needs:   . Lack of Transportation (Medical): Not on file  . Lack of Transportation (Non-Medical): Not on file  Physical Activity:   . Days of Exercise per Week: Not on file  . Minutes of Exercise per Session: Not on file  Stress:   . Feeling of Stress : Not on file  Social Connections:   . Frequency of Communication with Friends and Family: Not on file  . Frequency of Social Gatherings with Friends and Family: Not on file  . Attends Religious Services: Not on file  . Active Member of  Clubs or Organizations: Not on file  . Attends Archivist Meetings: Not on file  . Marital Status: Not on file    Family History  Problem Relation Age of Onset  . Diabetes Mother   . Diabetes Maternal Aunt        all maternal side  . Diabetes Sister        x 2  . Hypertension Maternal Aunt        all maternal side  . Lymphoma Father   . Heart attack Maternal Aunt   . Dementia Maternal Aunt     Review of Systems     Objective:  There were no vitals filed for this visit. BP Readings from Last 3 Encounters:  10/29/18 (!) 140/96  04/23/18 (!) 158/94  01/29/18 130/75   Wt Readings from Last 3 Encounters:  10/29/18 (!) 332 lb 12.8 oz (151 kg)  04/23/18 (!) 331 lb (150.1 kg)  12/25/17 (!) 326 lb (147.9 kg)   There is no height or weight on file to calculate BMI.   Physical Exam        Assessment & Plan:    See Problem List for Assessment and Plan of chronic medical problems.    This visit occurred during the SARS-CoV-2 public health emergency.  Safety protocols were in place, including screening questions prior to the visit, additional usage of staff PPE, and extensive cleaning of exam room while observing appropriate contact time as indicated for disinfecting solutions.    This encounter was created in error - please disregard.

## 2019-05-06 ENCOUNTER — Encounter: Payer: 59 | Admitting: Internal Medicine

## 2019-05-19 NOTE — Patient Instructions (Addendum)
  Blood work was ordered.     Medications reviewed and updated.  Changes include :   Increase amlodipine to 10 mg daily  Your prescription(s) have been submitted to your pharmacy. Please take as directed and contact our office if you believe you are having problem(s) with the medication(s).    Please followup in 6 months

## 2019-05-19 NOTE — Progress Notes (Signed)
Subjective:    Patient ID: Debra Le, female    DOB: 1975-09-20, 44 y.o.   MRN: [id number]  HPI The patient is here for follow up of their chronic medical problems, including diabetes, hypertension, hyperlipidemia, GERD, depression, anxiety, obesity  She is not exercising regularly.  She is taking all of her medications as prescribed.  Her appetite is lower and she is not eating as much.  She thinks she eats the wrong things and eats at the wrong times.  She is not eating too many sweets.     She had covid about one month ago.  She had the first injection Tuesday.  She had left arm pain and left axilla swelling.  She also had some anterior chest pain, but that has resolved.  She thinks the axilla swelling and discomfort is from the vaccine, but want to make sure.   Medications and allergies reviewed with patient and updated if appropriate.  Patient Active Problem List   Diagnosis Date Noted  . Iron deficiency 04/23/2018  . Verruca pedis 02/07/2018  . Hyperpigmented skin lesion 01/10/2018  . Skin abnormality 12/25/2017  . Other hyperlipidemia 03/23/2017  . Endometriosis 06/13/2016  . Depression 06/13/2016  . Diabetes ([place]) 09/20/2015  . Keloid 09/19/2015  . Lump of skin 09/19/2015  . Migraines 09/19/2015  . Essential hypertension, benign 03/09/2015  . GERD (gastroesophageal reflux disease) 03/09/2015  . Anxiety 03/09/2015  . Vitamin D deficiency 03/09/2015  . Morbid obesity ([place]) 03/09/2015    Current Outpatient Medications on File Prior to Visit  Medication Sig Dispense Refill  . amLODipine (NORVASC) 5 MG tablet Take 1 tablet (5 mg total) by mouth daily. 90 tablet 1  . ascorbic acid (VITAMIN C) 100 MG tablet Take 100 mg by mouth daily.    Marland Kitchen atorvastatin (LIPITOR) 20 MG tablet Take 1 tablet (20 mg total) by mouth daily. 90 tablet 3  . blood glucose meter kit and supplies KIT Dispense based on patient and insurance preference. Use up to four times daily as directed. 1  each 0  . buPROPion (WELLBUTRIN SR) 100 MG 12 hr tablet Take 1 tablet (100 mg total) by mouth 2 (two) times daily. 180 tablet 1  . Cyanocobalamin (B-12 PO) Take by mouth.    . escitalopram (LEXAPRO) 10 MG tablet Take 1.5 tablets (15 mg total) by mouth daily. 135 tablet 1  . esomeprazole (NEXIUM) 40 MG capsule TAKE 1 CAPSULE DAILY 90 capsule 0  . metFORMIN (GLUCOPHAGE) 500 MG tablet Take 1 tablet (500 mg total) by mouth 2 (two) times daily with a meal. 180 tablet 1  . metoprolol tartrate (LOPRESSOR) 50 MG tablet Take 1 tablet (50 mg total) by mouth 2 (two) times daily. 180 tablet 1  . Semaglutide, 1 MG/DOSE, (OZEMPIC, 1 MG/DOSE,) 2 MG/1.5ML SOPN Inject 1 mg into the skin once a week. 3 pen 1  . VITAMIN D, CHOLECALCIFEROL, PO Take by mouth.    . vitamin E 100 UNIT capsule Take by mouth daily.     No current facility-administered medications on file prior to visit.    Past Medical History:  Diagnosis Date  . Anxiety   . Depression   . Diabetes mellitus without complication ([place])   . GERD (gastroesophageal reflux disease)   . Hypertension   . Obesity     Past Surgical History:  Procedure Laterality Date  . CESAREAN SECTION  02/21/2005   with Tubal ligation    Social History   Socioeconomic History  .  Marital status: Married    Spouse name: Not on file  . Number of children: 2  . Years of education: Not on file  . Highest education level: Not on file  Occupational History  . Occupation: Therapist, sports  Tobacco Use  . Smoking status: Never Smoker  . Smokeless tobacco: Never Used  Substance and Sexual Activity  . Alcohol use: Yes    Alcohol/week: 7.0 standard drinks    Types: 7 Standard drinks or equivalent per week    Comment: red wine at night  . Drug use: No  . Sexual activity: Not on file  Other Topics Concern  . 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 Difficulty of Paying  Living Expenses: Not on file  Food Insecurity:   . Worried About Charity fundraiser in the Last Year: Not on file  . Ran Out of Food in the Last Year: Not on file  Transportation Needs:   . Lack of Transportation (Medical): Not on file  . Lack of Transportation (Non-Medical): Not on file  Physical Activity:   . Days of Exercise per Week: Not on file  . Minutes of Exercise per Session: Not on file  Stress:   . Feeling of Stress : Not on file  Social Connections:   . Frequency of Communication with Friends and Family: Not on file  . Frequency of Social Gatherings with Friends and Family: Not on file  . Attends Religious Services: Not on file  . Active Member of Clubs or Organizations: Not on file  . Attends Archivist Meetings: Not on file  . Marital Status: Not on file    Family History  Problem Relation Age of Onset  . Diabetes Mother   . Diabetes Maternal Aunt        all maternal side  . Diabetes Sister        x 2  . Hypertension Maternal Aunt        all maternal side  . Lymphoma Father   . Heart attack Maternal Aunt   . Dementia Maternal Aunt     Review of Systems  Constitutional: Negative for chills and fever.  Respiratory: Negative for cough, shortness of breath and wheezing.   Cardiovascular: Negative for chest pain, palpitations and leg swelling.  Neurological: Negative for light-headedness and headaches.       Objective:   Vitals:   05/20/19 0802  BP: (!) 146/92  Pulse: 78  Resp: 16  Temp: 97.9 F (36.6 C)  SpO2: 98%   BP Readings from Last 3 Encounters:  05/20/19 (!) 146/92  10/29/18 (!) 140/96  04/23/18 (!) 158/94   Wt Readings from Last 3 Encounters:  05/20/19 (!) 329 lb (149.2 kg)  10/29/18 (!) 332 lb 12.8 oz (151 kg)  04/23/18 (!) 331 lb (150.1 kg)   Body mass index is 47.21 kg/m.   Physical Exam    Constitutional: Appears well-developed and well-nourished. No distress.  HENT:  Head: Normocephalic and atraumatic.  Neck:  Neck supple. No tracheal deviation present. No thyromegaly present.  No cervical lymphadenopathy Cardiovascular: Normal rate, regular rhythm and normal heart sounds.   No murmur heard. No carotid bruit .  No edema Pulmonary/Chest: Effort normal and breath sounds normal. No respiratory distress. No has no wheezes. No rales. Axilla: Left axilla with possible swollen lymph node that is tender-exam limited by  her weight, no erythema or skin discoloration of concern Skin: Skin is warm and dry. Not diaphoretic.  Psychiatric: Normal mood and affect. Behavior is normal.      Assessment & Plan:    See Problem List for Assessment and Plan of chronic medical problems.    This visit occurred during the SARS-CoV-2 public health emergency.  Safety protocols were in place, including screening questions prior to the visit, additional usage of staff PPE, and extensive cleaning of exam room while observing appropriate contact time as indicated for disinfecting solutions.

## 2019-05-20 ENCOUNTER — Encounter: Payer: Self-pay | Admitting: Internal Medicine

## 2019-05-20 ENCOUNTER — Ambulatory Visit (INDEPENDENT_AMBULATORY_CARE_PROVIDER_SITE_OTHER): Payer: 59 | Admitting: Internal Medicine

## 2019-05-20 ENCOUNTER — Other Ambulatory Visit: Payer: Self-pay

## 2019-05-20 ENCOUNTER — Ambulatory Visit: Payer: Self-pay | Admitting: Internal Medicine

## 2019-05-20 VITALS — BP 146/92 | HR 78 | Temp 97.9°F | Resp 16 | Ht 70.0 in | Wt 321.0 lb

## 2019-05-20 DIAGNOSIS — E7849 Other hyperlipidemia: Secondary | ICD-10-CM

## 2019-05-20 DIAGNOSIS — F419 Anxiety disorder, unspecified: Secondary | ICD-10-CM

## 2019-05-20 DIAGNOSIS — I10 Essential (primary) hypertension: Secondary | ICD-10-CM

## 2019-05-20 DIAGNOSIS — F3289 Other specified depressive episodes: Secondary | ICD-10-CM

## 2019-05-20 DIAGNOSIS — E119 Type 2 diabetes mellitus without complications: Secondary | ICD-10-CM | POA: Diagnosis not present

## 2019-05-20 DIAGNOSIS — K219 Gastro-esophageal reflux disease without esophagitis: Secondary | ICD-10-CM | POA: Diagnosis not present

## 2019-05-20 LAB — COMPREHENSIVE METABOLIC PANEL
ALT: 18 U/L (ref 0–35)
AST: 15 U/L (ref 0–37)
Albumin: 3.8 g/dL (ref 3.5–5.2)
Alkaline Phosphatase: 134 U/L — ABNORMAL HIGH (ref 39–117)
BUN: 10 mg/dL (ref 6–23)
CO2: 26 mEq/L (ref 19–32)
Calcium: 9.2 mg/dL (ref 8.4–10.5)
Chloride: 103 mEq/L (ref 96–112)
Creatinine, Ser: 0.88 mg/dL (ref 0.40–1.20)
GFR: 84.82 mL/min (ref >60.00)
Glucose, Bld: 132 mg/dL — ABNORMAL HIGH (ref 70–99)
Potassium: 4.2 mEq/L (ref 3.5–5.1)
Sodium: 138 mEq/L (ref 135–145)
Total Bilirubin: 0.2 mg/dL (ref 0.2–1.2)
Total Protein: 7.8 g/dL (ref 6.0–8.3)

## 2019-05-20 LAB — LIPID PANEL
Cholesterol: 156 mg/dL (ref 0–200)
HDL: 41.7 mg/dL (ref >39.00)
LDL Cholesterol: 91 mg/dL (ref 0–99)
NonHDL: 114.03
Total CHOL/HDL Ratio: 4
Triglycerides: 114 mg/dL (ref 0.0–149.0)
VLDL: 22.8 mg/dL (ref 0.0–40.0)

## 2019-05-20 LAB — HEMOGLOBIN A1C: Hgb A1c MFr Bld: 6.8 % — ABNORMAL HIGH (ref 4.6–6.5)

## 2019-05-20 MED ORDER — METFORMIN HCL 500 MG PO TABS: 500.0000 mg | ORAL_TABLET | Freq: Two times a day (BID) | ORAL | 1 refills | Status: DC

## 2019-05-20 MED ORDER — ATORVASTATIN CALCIUM 20 MG PO TABS: 20.0000 mg | ORAL_TABLET | Freq: Every day | ORAL | 1 refills | Status: DC

## 2019-05-20 MED ORDER — OZEMPIC (1 MG/DOSE) 2 MG/1.5ML ~~LOC~~ SOPN: 1.0000 mg | PEN_INJECTOR | SUBCUTANEOUS | 1 refills | Status: DC

## 2019-05-20 MED ORDER — ESCITALOPRAM OXALATE 10 MG PO TABS: 15.0000 mg | ORAL_TABLET | Freq: Every day | ORAL | 1 refills | Status: DC

## 2019-05-20 MED ORDER — METOPROLOL TARTRATE 50 MG PO TABS: 50.0000 mg | ORAL_TABLET | Freq: Two times a day (BID) | ORAL | 1 refills | Status: DC

## 2019-05-20 MED ORDER — AMLODIPINE BESYLATE 10 MG PO TABS: 10.0000 mg | ORAL_TABLET | Freq: Every day | ORAL | 3 refills | Status: DC

## 2019-05-20 MED ORDER — BUPROPION HCL ER (SR) 100 MG PO TB12: 100.0000 mg | ORAL_TABLET | Freq: Two times a day (BID) | ORAL | 1 refills | Status: DC

## 2019-05-20 NOTE — Assessment & Plan Note (Signed)
Chronic Controlled, stable Continue current dose of medication-Lexapro 15 mg daily, bupropion 100 mg twice daily

## 2019-05-20 NOTE — Assessment & Plan Note (Signed)
Chronic Check lipid panel  Continue daily statin Regular exercise and healthy diet encouraged  

## 2019-05-20 NOTE — Assessment & Plan Note (Signed)
Chronic BMI 47 She is not currently exercising, but plans on restarting She understands the importance of weight loss She will work on her eating-currently feels she is eating the wrong foods and at the wrong times, but not eating too much Follow-up in 6 months

## 2019-05-20 NOTE — Assessment & Plan Note (Signed)
Chronic Controlled, stable Continue current dose of medication  

## 2019-05-20 NOTE — Assessment & Plan Note (Signed)
Chronic Blood pressure not ideally controlled Increase amlodipine to 10 mg daily Continue metoprolol at current dose She will let me know if she has increased edema with the increased dose of amlodipine Encouraged to monitor blood pressure at home Stressed the importance of regular exercise and weight loss Low-sodium diet CMP

## 2019-05-20 NOTE — Assessment & Plan Note (Signed)
Chronic Sugars have been well controlled She is fairly compliant with a diabetic diet Stressed the importance of regular exercise and weight loss and she will work on that Check A1c Continue current medications

## 2019-05-24 ENCOUNTER — Telehealth: Payer: Self-pay | Admitting: Internal Medicine

## 2019-05-24 MED ORDER — METOPROLOL TARTRATE 50 MG PO TABS: 50.0000 mg | ORAL_TABLET | Freq: Two times a day (BID) | ORAL | 1 refills | Status: DC

## 2019-05-24 MED ORDER — ESOMEPRAZOLE MAGNESIUM 40 MG PO CPDR: 40.0000 mg | DELAYED_RELEASE_CAPSULE | Freq: Every day | ORAL | 1 refills | Status: DC

## 2019-05-24 MED ORDER — ESCITALOPRAM OXALATE 10 MG PO TABS: 15.0000 mg | ORAL_TABLET | Freq: Every day | ORAL | 1 refills | Status: DC

## 2019-05-24 MED ORDER — METFORMIN HCL 500 MG PO TABS: 500.0000 mg | ORAL_TABLET | Freq: Two times a day (BID) | ORAL | 1 refills | Status: DC

## 2019-05-24 MED ORDER — OZEMPIC (1 MG/DOSE) 2 MG/1.5ML ~~LOC~~ SOPN: 1.0000 mg | PEN_INJECTOR | SUBCUTANEOUS | 1 refills | Status: DC

## 2019-05-24 MED ORDER — AMLODIPINE BESYLATE 10 MG PO TABS: 10.0000 mg | ORAL_TABLET | Freq: Every day | ORAL | 1 refills | Status: DC

## 2019-05-24 MED ORDER — BUPROPION HCL ER (SR) 100 MG PO TB12: 100.0000 mg | ORAL_TABLET | Freq: Two times a day (BID) | ORAL | 1 refills | Status: DC

## 2019-05-24 MED ORDER — ATORVASTATIN CALCIUM 20 MG PO TABS: 20.0000 mg | ORAL_TABLET | Freq: Every day | ORAL | 1 refills | Status: DC

## 2019-05-24 NOTE — Telephone Encounter (Signed)
Rx sent 

## 2019-05-24 NOTE — Telephone Encounter (Signed)
    [facility] . Patient no longer uses [facility]      1. Which medications need to be refilled? (please list name of each medication and dose if known) ALL MEDS  2. Which pharmacy/location (including street and city if local pharmacy) is medication to be sent to? [id number] [place], [place] - [address]  3. Do they need a 30 day or 90 day supply? [place]

## 2019-05-27 ENCOUNTER — Telehealth: Payer: Self-pay

## 2019-05-27 NOTE — Telephone Encounter (Signed)
Change insurance   Need prior authorization   Medication Requested: Semaglutide, 1 MG/DOSE, (OZEMPIC, 1 MG/DOSE,) 2 MG/1.5ML SOPN  Is medication on med list: Yes  (if no, inform pt they may need an appointment)  Is medication a controled: No  (yes = last OV with PCP)  -Controlled Substances: Adderall, Ritalin, oxycodone, hydrocodone, methadone, alprazolam, etc  Last visit with PCP:  2.12.21   Is the OV > than 4 months: (yes = schedule an appt if one is not already made)  Pharmacy (Name[facility] [place] [address]

## 2019-05-30 NOTE — Telephone Encounter (Signed)
PA initiated- [id number]

## 2019-06-01 NOTE — Telephone Encounter (Signed)
PA approved. Called pt and LVM letting her know.

## 2019-06-08 ENCOUNTER — Telehealth: Payer: Self-pay

## 2019-06-08 MED ORDER — OZEMPIC (1 MG/DOSE) 2 MG/1.5ML ~~LOC~~ SOPN: 1.0000 mg | PEN_INJECTOR | SUBCUTANEOUS | 1 refills | Status: DC

## 2019-06-08 NOTE — Telephone Encounter (Signed)
New message    The patient calling  Semaglutide, 1 MG/DOSE, (OZEMPIC, 1 MG/DOSE,) 2 MG/1.5ML SOPN   [facility] [address] needs clarification on 3 syringes or  3 bottles for 90 day supply.

## 2019-06-08 NOTE — Telephone Encounter (Signed)
Rx sent 

## 2019-07-26 ENCOUNTER — Telehealth: Payer: Self-pay

## 2019-07-26 NOTE — Telephone Encounter (Signed)
New message    The patient calls wanted to set up a new patient appt for her daughter to see Dr. Quay Burow.   The mom voiced that Dr. Quay Burow told her when she turns 74 to give [place] a call and set up a new patient appt.   The mom verbalized understanding we will need to call her back after speaking with Dr. Quay Burow on acceptance.

## 2019-07-26 NOTE — Telephone Encounter (Signed)
Yes, I will accept her since I already agreed to.  Okay to set up a new patient appointment.

## 2019-07-28 NOTE — Telephone Encounter (Signed)
F/u  Call the patient her daughter appt is made on  4.26.21 with Dr. Quay Burow

## 2019-09-06 ENCOUNTER — Telehealth: Payer: Self-pay | Admitting: Internal Medicine

## 2019-09-06 NOTE — Telephone Encounter (Signed)
Called pt with response to below.

## 2019-09-06 NOTE — Telephone Encounter (Signed)
New message:    Pt is calling and states she would like to know her last A1C and BP readings. Please advise.

## 2019-09-30 LAB — HM DIABETES EYE EXAM

## 2019-10-26 ENCOUNTER — Encounter: Payer: Self-pay | Admitting: Internal Medicine

## 2019-10-26 NOTE — Progress Notes (Signed)
Outside notes received. Information abstracted. Notes sent to scan.  

## 2019-11-14 ENCOUNTER — Other Ambulatory Visit: Payer: Self-pay | Admitting: Internal Medicine

## 2019-11-15 ENCOUNTER — Other Ambulatory Visit: Payer: Self-pay | Admitting: Internal Medicine

## 2019-11-17 ENCOUNTER — Encounter: Payer: 59 | Admitting: Internal Medicine

## 2019-11-21 ENCOUNTER — Encounter: Payer: 59 | Admitting: Internal Medicine

## 2019-11-24 NOTE — Progress Notes (Signed)
Subjective:    Patient ID: Debra Le, female    DOB: February 17, 1976, 44 y.o.   MRN: [id number]  HPI She is here for a physical exam.   She has no concerns  Medications and allergies reviewed with patient and updated if appropriate.  Patient Active Problem List   Diagnosis Date Noted  . Iron deficiency 04/23/2018  . Verruca pedis 02/07/2018  . Hyperpigmented skin lesion 01/10/2018  . Skin abnormality 12/25/2017  . Other hyperlipidemia 03/23/2017  . Endometriosis 06/13/2016  . Depression 06/13/2016  . Diabetes ([place]) 09/20/2015  . Keloid 09/19/2015  . Lump of skin 09/19/2015  . Migraines 09/19/2015  . Essential hypertension, benign 03/09/2015  . GERD (gastroesophageal reflux disease) 03/09/2015  . Anxiety 03/09/2015  . Vitamin D deficiency 03/09/2015  . Morbid obesity ([place]) 03/09/2015    Current Outpatient Medications on File Prior to Visit  Medication Sig Dispense Refill  . amLODipine (NORVASC) 10 MG tablet TAKE 1 TABLET BY MOUTH EVERY DAY 90 tablet 1  . atorvastatin (LIPITOR) 20 MG tablet TAKE 1 TABLET BY MOUTH EVERY DAY 90 tablet 1  . blood glucose meter kit and supplies KIT Dispense based on patient and insurance preference. Use up to four times daily as directed. 1 each 0  . buPROPion (WELLBUTRIN SR) 100 MG 12 hr tablet Take 1 tablet (100 mg total) by mouth 2 (two) times daily. 180 tablet 1  . escitalopram (LEXAPRO) 10 MG tablet Take 1.5 tablets (15 mg total) by mouth daily. 135 tablet 1  . esomeprazole (NEXIUM) 40 MG capsule Take 1 capsule (40 mg total) by mouth daily. 90 capsule 1  . INCASSIA 0.35 MG tablet Take 1 tablet by mouth daily.    Lenda Kelp FE 1.5/30 1.5-30 MG-MCG tablet Take 1 tablet by mouth daily.    . metFORMIN (GLUCOPHAGE) 500 MG tablet Take 1 tablet (500 mg total) by mouth 2 (two) times daily with a meal. 180 tablet 1  . metoprolol tartrate (LOPRESSOR) 50 MG tablet Take 1 tablet (50 mg total) by mouth 2 (two) times daily. 180 tablet 1  . Semaglutide, 1  MG/DOSE, (OZEMPIC, 1 MG/DOSE,) 2 MG/1.5ML SOPN Inject 1 mg into the skin once a week. 18 mL 1   No current facility-administered medications on file prior to visit.    Past Medical History:  Diagnosis Date  . Anxiety   . Depression   . Diabetes mellitus without complication ([place])   . GERD (gastroesophageal reflux disease)   . Hypertension   . Obesity     Past Surgical History:  Procedure Laterality Date  . CESAREAN SECTION  02/21/2005   with Tubal ligation    Social History   Socioeconomic History  . Marital status: Married    Spouse name: Not on file  . Number of children: 2  . Years of education: Not on file  . Highest education level: Not on file  Occupational History  . Occupation: Therapist, sports  Tobacco Use  . Smoking status: Never Smoker  . Smokeless tobacco: Never Used  Substance and Sexual Activity  . Alcohol use: Yes    Alcohol/week: 7.0 standard drinks    Types: 7 Standard drinks or equivalent per week    Comment: red wine at night  . Drug use: No  . Sexual activity: Not on file  Other Topics Concern  . Not on file  Social History Narrative   Psychiatric NP with [employer]      Social Determinants of Radio broadcast assistant  Strain:   . Difficulty of Paying Living Expenses: Not on file  Food Insecurity:   . Worried About Charity fundraiser in the Last Year: Not on file  . Ran Out of Food in the Last Year: Not on file  Transportation Needs:   . Lack of Transportation (Medical): Not on file  . Lack of Transportation (Non-Medical): Not on file  Physical Activity:   . Days of Exercise per Week: Not on file  . Minutes of Exercise per Session: Not on file  Stress:   . Feeling of Stress : Not on file  Social Connections:   . Frequency of Communication with Friends and Family: Not on file  . Frequency of Social Gatherings with Friends and Family: Not on file  . Attends Religious Services: Not on file  . Active Member of Clubs or Organizations: Not on file    . Attends Archivist Meetings: Not on file  . Marital Status: Not on file    Family History  Problem Relation Age of Onset  . Diabetes Mother   . Diabetes Maternal Aunt        all maternal side  . Diabetes Sister        x 2  . Hypertension Maternal Aunt        all maternal side  . Lymphoma Father   . Heart attack Maternal Aunt   . Dementia Maternal Aunt     Review of Systems  Constitutional: Negative for chills and fever.  Eyes: Negative for visual disturbance.  Respiratory: Negative for cough, shortness of breath and wheezing.   Cardiovascular: Negative for chest pain, palpitations and leg swelling.  Gastrointestinal: Negative for abdominal pain, blood in stool, constipation, diarrhea and nausea.       No gerd  Genitourinary: Negative for dysuria and hematuria.  Musculoskeletal: Negative for arthralgias and back pain.  Skin: Negative for rash.  Neurological: Negative for dizziness, light-headedness, numbness and headaches.  Psychiatric/Behavioral: Positive for dysphoric mood. The patient is nervous/anxious.        Objective:   Vitals:   11/25/19 1318  BP: 136/82  Pulse: 91  Temp: 98.1 F (36.7 C)  SpO2: 99%   Filed Weights   11/25/19 1318  Weight: (!) 319 lb (144.7 kg)   Body mass index is 45.77 kg/m.  BP Readings from Last 3 Encounters:  11/25/19 136/82  05/20/19 (!) 146/92  10/29/18 (!) 140/96    Wt Readings from Last 3 Encounters:  11/25/19 (!) 319 lb (144.7 kg)  05/20/19 (!) 321 lb (145.6 kg)  10/29/18 (!) 332 lb 12.8 oz (151 kg)     Physical Exam Constitutional: She appears well-developed and well-nourished. No distress.  HENT:  Head: Normocephalic and atraumatic.  Right Ear: External ear normal. Normal ear canal and TM Left Ear: External ear normal.  Normal ear canal and TM Mouth/Throat: Oropharynx is clear and moist.  Eyes: Conjunctivae and EOM are normal.  Neck: Neck supple. No tracheal deviation present. No thyromegaly  present.  No carotid bruit  Cardiovascular: Normal rate, regular rhythm and normal heart sounds.   No murmur heard.  No edema. Pulmonary/Chest: Effort normal and breath sounds normal. No respiratory distress. She has no wheezes. She has no rales.  Breast: deferred   Abdominal: Soft. She exhibits no distension. There is no tenderness.  Lymphadenopathy: She has no cervical adenopathy.  Skin: Skin is warm and dry. She is not diaphoretic.  Psychiatric: She has a normal mood and affect. Her  behavior is normal.   Diabetic Foot Exam - Simple   Simple Foot Form Diabetic Foot exam was performed with the following findings: Yes 11/25/2019  1:39 PM  Visual Inspection No deformities, no ulcerations, no other skin breakdown bilaterally: Yes Sensation Testing Intact to touch and monofilament testing bilaterally: Yes Pulse Check Posterior Tibialis and Dorsalis pulse intact bilaterally: Yes Comments         Assessment & Plan:   Physical exam: Screening blood work    ordered Immunizations  Had covid, others up to date Mammogram  Up to date  Gyn  Up to date  Eye exams  Up to date  Exercise  Not regular Weight  Discussed weight loss Substance abuse   none  See Problem List for Assessment and Plan of chronic medical problems.   This visit occurred during the SARS-CoV-2 public health emergency.  Safety protocols were in place, including screening questions prior to the visit, additional usage of staff PPE, and extensive cleaning of exam room while observing appropriate contact time as indicated for disinfecting solutions.

## 2019-11-24 NOTE — Patient Instructions (Addendum)
Blood work was ordered.    All other Health Maintenance issues reviewed.   All recommended immunizations and age-appropriate screenings are up-to-date or discussed.  No immunization administered today.   Medications reviewed and updated.  Changes include :     Your prescription(s) have been submitted to your pharmacy. Please take as directed and contact our office if you believe you are having problem(s) with the medication(s).  A referral was ordered for      Someone will call you to schedule an appointment.    Please followup in 6 months    Health Maintenance, Female Adopting a healthy lifestyle and getting preventive care are important in promoting health and wellness. Ask your health care provider about:  The right schedule for you to have regular tests and exams.  Things you can do on your own to prevent diseases and keep yourself healthy. What should I know about diet, weight, and exercise? Eat a healthy diet   Eat a diet that includes plenty of vegetables, fruits, low-fat dairy products, and lean protein.  Do not eat a lot of foods that are high in solid fats, added sugars, or sodium. Maintain a healthy weight Body mass index (BMI) is used to identify weight problems. It estimates body fat based on height and weight. Your health care provider can help determine your BMI and help you achieve or maintain a healthy weight. Get regular exercise Get regular exercise. This is one of the most important things you can do for your health. Most adults should:  Exercise for at least 150 minutes each week. The exercise should increase your heart rate and make you sweat (moderate-intensity exercise).  Do strengthening exercises at least twice a week. This is in addition to the moderate-intensity exercise.  Spend less time sitting. Even light physical activity can be beneficial. Watch cholesterol and blood lipids Have your blood tested for lipids and cholesterol at 43 years of  age, then have this test every 5 years. Have your cholesterol levels checked more often if:  Your lipid or cholesterol levels are high.  You are older than 43 years of age.  You are at high risk for heart disease. What should I know about cancer screening? Depending on your health history and family history, you may need to have cancer screening at various ages. This may include screening for:  Breast cancer.  Cervical cancer.  Colorectal cancer.  Skin cancer.  Lung cancer. What should I know about heart disease, diabetes, and high blood pressure? Blood pressure and heart disease  High blood pressure causes heart disease and increases the risk of stroke. This is more likely to develop in people who have high blood pressure readings, are of African descent, or are overweight.  Have your blood pressure checked: ? Every 3-5 years if you are 62-42 years of age. ? Every year if you are 78 years old or older. Diabetes Have regular diabetes screenings. This checks your fasting blood sugar level. Have the screening done:  Once every three years after age 59 if you are at a normal weight and have a low risk for diabetes.  More often and at a younger age if you are overweight or have a high risk for diabetes. What should I know about preventing infection? Hepatitis B If you have a higher risk for hepatitis B, you should be screened for this virus. Talk with your health care provider to find out if you are at risk for hepatitis B infection. Hepatitis C  Testing is recommended for:  Everyone born from 1945 through 1965.  Anyone with known risk factors for hepatitis C. Sexually transmitted infections (STIs)  Get screened for STIs, including gonorrhea and chlamydia, if: ? You are sexually active and are younger than 43 years of age. ? You are older than 43 years of age and your health care provider tells you that you are at risk for this type of infection. ? Your sexual activity has  changed since you were last screened, and you are at increased risk for chlamydia or gonorrhea. Ask your health care provider if you are at risk.  Ask your health care provider about whether you are at high risk for HIV. Your health care provider may recommend a prescription medicine to help prevent HIV infection. If you choose to take medicine to prevent HIV, you should first get tested for HIV. You should then be tested every 3 months for as long as you are taking the medicine. Pregnancy  If you are about to stop having your period (premenopausal) and you may become pregnant, seek counseling before you get pregnant.  Take 400 to 800 micrograms (mcg) of folic acid every day if you become pregnant.  Ask for birth control (contraception) if you want to prevent pregnancy. Osteoporosis and menopause Osteoporosis is a disease in which the bones lose minerals and strength with aging. This can result in bone fractures. If you are 65 years old or older, or if you are at risk for osteoporosis and fractures, ask your health care provider if you should:  Be screened for bone loss.  Take a calcium or vitamin D supplement to lower your risk of fractures.  Be given hormone replacement therapy (HRT) to treat symptoms of menopause. Follow these instructions at home: Lifestyle  Do not use any products that contain nicotine or tobacco, such as cigarettes, e-cigarettes, and chewing tobacco. If you need help quitting, ask your health care provider.  Do not use street drugs.  Do not share needles.  Ask your health care provider for help if you need support or information about quitting drugs. Alcohol use  Do not drink alcohol if: ? Your health care provider tells you not to drink. ? You are pregnant, may be pregnant, or are planning to become pregnant.  If you drink alcohol: ? Limit how much you use to 0-1 drink a day. ? Limit intake if you are breastfeeding.  Be aware of how much alcohol is in  your drink. In the [place] one drink equals one 12 oz bottle of beer (355 mL), one 5 oz glass of wine (148 mL), or one 1 oz glass of hard liquor (44 mL). General instructions  Schedule regular health, dental, and eye exams.  Stay current with your vaccines.  Tell your health care provider if: ? You often feel depressed. ? You have ever been abused or do not feel safe at home. Summary  Adopting a healthy lifestyle and getting preventive care are important in promoting health and wellness.  Follow your health care provider's instructions about healthy diet, exercising, and getting tested or screened for diseases.  Follow your health care provider's instructions on monitoring your cholesterol and blood pressure. This information is not intended to replace advice given to you by your health care provider. Make sure you discuss any questions you have with your health care provider. Document Revised: 03/17/2018 Document Reviewed: 03/17/2018 Elsevier Patient Education  2020 Elsevier Inc.  

## 2019-11-25 ENCOUNTER — Telehealth: Payer: Self-pay

## 2019-11-25 ENCOUNTER — Encounter: Payer: Self-pay | Admitting: Internal Medicine

## 2019-11-25 ENCOUNTER — Ambulatory Visit (INDEPENDENT_AMBULATORY_CARE_PROVIDER_SITE_OTHER): Payer: 59 | Admitting: Internal Medicine

## 2019-11-25 ENCOUNTER — Other Ambulatory Visit: Payer: Self-pay

## 2019-11-25 VITALS — BP 136/82 | HR 91 | Temp 98.1°F | Ht 70.0 in | Wt 319.0 lb

## 2019-11-25 DIAGNOSIS — K219 Gastro-esophageal reflux disease without esophagitis: Secondary | ICD-10-CM

## 2019-11-25 DIAGNOSIS — F3289 Other specified depressive episodes: Secondary | ICD-10-CM

## 2019-11-25 DIAGNOSIS — Z0000 Encounter for general adult medical examination without abnormal findings: Secondary | ICD-10-CM | POA: Diagnosis not present

## 2019-11-25 DIAGNOSIS — I10 Essential (primary) hypertension: Secondary | ICD-10-CM | POA: Diagnosis not present

## 2019-11-25 DIAGNOSIS — E7849 Other hyperlipidemia: Secondary | ICD-10-CM

## 2019-11-25 DIAGNOSIS — G43809 Other migraine, not intractable, without status migrainosus: Secondary | ICD-10-CM | POA: Diagnosis not present

## 2019-11-25 DIAGNOSIS — F419 Anxiety disorder, unspecified: Secondary | ICD-10-CM

## 2019-11-25 DIAGNOSIS — E119 Type 2 diabetes mellitus without complications: Secondary | ICD-10-CM

## 2019-11-25 DIAGNOSIS — H9319 Tinnitus, unspecified ear: Secondary | ICD-10-CM

## 2019-11-25 NOTE — Assessment & Plan Note (Signed)
Chronic GERD controlled Continue daily medication nexium 40 mg daily

## 2019-11-25 NOTE — Telephone Encounter (Signed)
ordered

## 2019-11-25 NOTE — Assessment & Plan Note (Signed)
Chronic Check a1c, urine micro Low sugar / carb diet Stressed regular exercise Will work on weight loss Continue current meds

## 2019-11-25 NOTE — Assessment & Plan Note (Signed)
Chronic Check lipid panel  Continue daily statin Regular exercise and healthy diet encouraged  

## 2019-11-25 NOTE — Assessment & Plan Note (Signed)
Chronic Controlled, stable Continue current dose of medication  

## 2019-11-25 NOTE — Assessment & Plan Note (Signed)
Chronic BP well controlled Current regimen effective and well tolerated Continue current medications at current doses cmp  

## 2019-11-25 NOTE — Assessment & Plan Note (Signed)
Chronic Controlled On OCP's  Takes aleve as needed

## 2019-11-26 LAB — COMPREHENSIVE METABOLIC PANEL
AG Ratio: 1.3 (calc) (ref 1.0–2.5)
ALT: 21 U/L (ref 6–29)
AST: 17 U/L (ref 10–30)
Albumin: 3.9 g/dL (ref 3.6–5.1)
Alkaline phosphatase (APISO): 129 U/L — ABNORMAL HIGH (ref 31–125)
BUN: 7 mg/dL (ref 7–25)
CO2: 27 mmol/L (ref 20–32)
Calcium: 8.9 mg/dL (ref 8.6–10.2)
Chloride: 104 mmol/L (ref 98–110)
Creat: 0.81 mg/dL (ref 0.50–1.10)
Globulin: 3 g/dL (calc) (ref 1.9–3.7)
Glucose, Bld: 82 mg/dL (ref 65–99)
Potassium: 4.4 mmol/L (ref 3.5–5.3)
Sodium: 137 mmol/L (ref 135–146)
Total Bilirubin: 0.3 mg/dL (ref 0.2–1.2)
Total Protein: 6.9 g/dL (ref 6.1–8.1)

## 2019-11-26 LAB — LIPID PANEL
Cholesterol: 129 mg/dL (ref <200)
HDL: 34 mg/dL — ABNORMAL LOW (ref >=50)
LDL Cholesterol (Calc): 80 mg/dL (calc)
Non-HDL Cholesterol (Calc): 95 mg/dL (calc) (ref <130)
Total CHOL/HDL Ratio: 3.8 (calc) (ref <5.0)
Triglycerides: 71 mg/dL (ref <150)

## 2019-11-26 LAB — CBC WITH DIFFERENTIAL/PLATELET
Absolute Monocytes: 466 cells/uL (ref 200–950)
Basophils Absolute: 24 cells/uL (ref 0–200)
Basophils Relative: 0.3 %
Eosinophils Absolute: 47 cells/uL (ref 15–500)
Eosinophils Relative: 0.6 %
HCT: 33 % — ABNORMAL LOW (ref 35.0–45.0)
Hemoglobin: 10.9 g/dL — ABNORMAL LOW (ref 11.7–15.5)
Lymphs Abs: 3105 cells/uL (ref 850–3900)
MCH: 28.9 pg (ref 27.0–33.0)
MCHC: 33 g/dL (ref 32.0–36.0)
MCV: 87.5 fL (ref 80.0–100.0)
MPV: 10.1 fL (ref 7.5–12.5)
Monocytes Relative: 5.9 %
Neutro Abs: 4258 cells/uL (ref 1500–7800)
Neutrophils Relative %: 53.9 %
Platelets: 363 10*3/uL (ref 140–400)
RBC: 3.77 10*6/uL — ABNORMAL LOW (ref 3.80–5.10)
RDW: 13.2 % (ref 11.0–15.0)
Total Lymphocyte: 39.3 %
WBC: 7.9 10*3/uL (ref 3.8–10.8)

## 2019-11-26 LAB — HEMOGLOBIN A1C
Hgb A1c MFr Bld: 5.9 % of total Hgb — ABNORMAL HIGH (ref <5.7)
Mean Plasma Glucose: 123 (calc)
eAG (mmol/L): 6.8 (calc)

## 2019-11-26 LAB — MICROALBUMIN / CREATININE URINE RATIO
Creatinine, Urine: 249 mg/dL (ref 20–275)
Microalb Creat Ratio: 4 mcg/mg creat (ref <30)
Microalb, Ur: 1.1 mg/dL

## 2019-11-26 LAB — TSH: TSH: 1.52 mIU/L

## 2019-11-29 ENCOUNTER — Other Ambulatory Visit: Payer: Self-pay

## 2019-11-29 MED ORDER — OZEMPIC (1 MG/DOSE) 2 MG/1.5ML ~~LOC~~ SOPN: 1.0000 mg | PEN_INJECTOR | SUBCUTANEOUS | 1 refills | Status: DC

## 2019-11-29 MED ORDER — METOPROLOL TARTRATE 50 MG PO TABS
50.0000 mg | ORAL_TABLET | Freq: Two times a day (BID) | ORAL | 1 refills | Status: DC
Start: 2019-11-29 — End: 2020-04-26

## 2019-11-29 MED ORDER — ESOMEPRAZOLE MAGNESIUM 40 MG PO CPDR: 40.0000 mg | DELAYED_RELEASE_CAPSULE | Freq: Every day | ORAL | 1 refills | Status: DC

## 2019-11-29 MED ORDER — METFORMIN HCL 500 MG PO TABS
500.0000 mg | ORAL_TABLET | Freq: Two times a day (BID) | ORAL | 1 refills | Status: DC
Start: 2019-11-29 — End: 2020-04-26

## 2019-11-30 MED ORDER — ESCITALOPRAM OXALATE 10 MG PO TABS
15.0000 mg | ORAL_TABLET | Freq: Every day | ORAL | 1 refills | Status: DC
Start: 2019-11-30 — End: 2020-04-26

## 2019-11-30 MED ORDER — BUPROPION HCL ER (SR) 100 MG PO TB12
100.0000 mg | ORAL_TABLET | Freq: Two times a day (BID) | ORAL | 1 refills | Status: DC
Start: 2019-11-30 — End: 2020-04-26

## 2020-01-20 ENCOUNTER — Ambulatory Visit (INDEPENDENT_AMBULATORY_CARE_PROVIDER_SITE_OTHER): Payer: 59 | Admitting: Otolaryngology

## 2020-01-20 ENCOUNTER — Encounter (INDEPENDENT_AMBULATORY_CARE_PROVIDER_SITE_OTHER): Payer: Self-pay | Admitting: Otolaryngology

## 2020-01-20 ENCOUNTER — Other Ambulatory Visit: Payer: Self-pay

## 2020-01-20 VITALS — Temp 97.2°F

## 2020-01-20 DIAGNOSIS — H9313 Tinnitus, bilateral: Secondary | ICD-10-CM

## 2020-01-20 NOTE — Progress Notes (Signed)
HPI: Debra Le is a 44 y.o. female who presents is referred by by her PCP for evaluation of tinnitus that she has had since April.  She describes high-pitched ringing in both ears.  She denies any hearing problems.  She apparently had Covid back in January and received Mederna Covid shots in February and March.. On further review of her work history she used to work around loud noise when she was in her 26s and had earplugs that she used most of the time.  Past Medical History:  Diagnosis Date  . Anxiety   . Depression   . Diabetes mellitus without complication ([place])   . GERD (gastroesophageal reflux disease)   . Hypertension   . Obesity    Past Surgical History:  Procedure Laterality Date  . CESAREAN SECTION  02/21/2005   with Tubal ligation   Social History   Socioeconomic History  . Marital status: Married    Spouse name: Not on file  . Number of children: 2  . Years of education: Not on file  . Highest education level: Not on file  Occupational History  . Occupation: Therapist, sports  Tobacco Use  . Smoking status: Never Smoker  . Smokeless tobacco: Never Used  Substance and Sexual Activity  . Alcohol use: Yes    Alcohol/week: 7.0 standard drinks    Types: 7 Standard drinks or equivalent per week    Comment: red wine at night  . Drug use: No  . Sexual activity: Not on file  Other Topics Concern  . 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 Difficulty of Paying Living Expenses: Not on file  Food Insecurity:   . Worried About Charity fundraiser in the Last Year: Not on file  . Ran Out of Food in the Last Year: Not on file  Transportation Needs:   . Lack of Transportation (Medical): Not on file  . Lack of Transportation (Non-Medical): Not on file  Physical Activity:   . Days of Exercise per Week: Not on file  . Minutes of Exercise per Session: Not on file  Stress:   .  Feeling of Stress : Not on file  Social Connections:   . Frequency of Communication with Friends and Family: Not on file  . Frequency of Social Gatherings with Friends and Family: Not on file  . Attends Religious Services: Not on file  . Active Member of Clubs or Organizations: Not on file  . Attends Archivist Meetings: Not on file  . Marital Status: Not on file   Family History  Problem Relation Age of Onset  . Diabetes Mother   . Diabetes Maternal Aunt        all maternal side  . Diabetes Sister        x 2  . Hypertension Maternal Aunt        all maternal side  . Lymphoma Father   . Heart attack Maternal Aunt   . Dementia Maternal Aunt    No Known Allergies Prior to Admission medications   Medication Sig Start Date End Date Taking? Authorizing Provider  amLODipine (NORVASC) 10 MG tablet TAKE 1 TABLET BY MOUTH EVERY DAY 11/14/19  Yes Burns, Claudina Lick, MD  atorvastatin (LIPITOR) 20 MG tablet TAKE 1 TABLET BY MOUTH EVERY DAY 11/15/19  Yes Burns, Claudina Lick, MD  blood glucose meter kit and  supplies KIT Dispense based on patient and insurance preference. Use up to four times daily as directed. 06/24/16  Yes Burns, Claudina Lick, MD  buPROPion (WELLBUTRIN SR) 100 MG 12 hr tablet Take 1 tablet (100 mg total) by mouth 2 (two) times daily. 11/30/19  Yes Burns, Claudina Lick, MD  escitalopram (LEXAPRO) 10 MG tablet Take 1.5 tablets (15 mg total) by mouth daily. 11/30/19  Yes Burns, Claudina Lick, MD  esomeprazole (NEXIUM) 40 MG capsule Take 1 capsule (40 mg total) by mouth daily. 11/29/19  Yes Burns, Claudina Lick, MD  INCASSIA 0.35 MG tablet Take 1 tablet by mouth daily. 09/09/19  Yes [provider]  JUNEL FE 1.5/30 1.5-30 MG-MCG tablet Take 1 tablet by mouth daily. 08/17/19  Yes [provider]  metFORMIN (GLUCOPHAGE) 500 MG tablet Take 1 tablet (500 mg total) by mouth 2 (two) times daily with a meal. 11/29/19  Yes Burns, Claudina Lick, MD  metoprolol tartrate (LOPRESSOR) 50 MG tablet Take 1 tablet (50  mg total) by mouth 2 (two) times daily. 11/29/19  Yes Burns, Claudina Lick, MD  Semaglutide, 1 MG/DOSE, (OZEMPIC, 1 MG/DOSE,) 2 MG/1.5ML SOPN Inject 0.75 mLs (1 mg total) into the skin once a week. 11/29/19  Yes Burns, Claudina Lick, MD     Positive ROS: Otherwise negative  All other systems have been reviewed and were otherwise negative with the exception of those mentioned in the HPI and as above.  Physical Exam: Constitutional: Alert, well-appearing, no acute distress Ears: External ears without lesions or tenderness. Ear canals are clear bilaterally with intact, clear TMs bilaterally. Nasal: External nose without lesions. Clear nasal passages Oral: Lips and gums without lesions. Tongue and palate mucosa without lesions. Posterior oropharynx clear. Neck: No palpable adenopathy or masses Respiratory: Breathing comfortably  Skin: No facial/neck lesions or rash noted.  Audiogram in the office today demonstrated normal hearing in both ears up to 4000 frequency with what appears to be mild noise-induced hearing loss at 6000 frequency down to 40 dB which was symmetric.  SRT's were 5 dB bilaterally with type A tympanograms bilaterally.  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  Plan: Reviewed with her concerning tinnitus and limited treatment for tinnitus. Discussed with her concerning using masking noise to help with the tinnitus when things are quiet and the tinnitus is bothersome. Also gave her some samples of Lipo flavonoid to try as this is beneficial in some people with tinnitus.   Radene Journey, MD   CC:

## 2020-01-24 ENCOUNTER — Encounter (INDEPENDENT_AMBULATORY_CARE_PROVIDER_SITE_OTHER): Payer: Self-pay

## 2020-03-15 ENCOUNTER — Telehealth: Payer: Self-pay | Admitting: Internal Medicine

## 2020-03-15 NOTE — Telephone Encounter (Signed)
Paperwork dropped off for both [employer] and [employer].  Please call Bless at [phone] when forms are completed and ready to be picked up.  Forms placed in Providers box

## 2020-03-16 NOTE — Telephone Encounter (Signed)
Forms completed.  Spoke with Debra Le today. She will pick up this afternoon.  Forms taken up front and left under patient section for pick up.

## 2020-04-15 NOTE — Patient Instructions (Addendum)
You have shingles.  Medications changes include :   Valtrex 1 gm three times a day, triamcinolone cream twice daily to the rash,  Gabapentin 1-2 times daily for pain --- this can be increased.    Your prescription(s) have been submitted to your pharmacy. Please take as directed and contact our office if you believe you are having problem(s) with the medication(s).      Shingles  Shingles, which is also known as herpes zoster, is an infection that causes a painful skin rash and fluid-filled blisters. It is caused by a virus. Shingles only develops in people who:  Have had chickenpox.  Have been given a medicine to protect against chickenpox (have been vaccinated). Shingles is rare in this group. What are the causes? Shingles is caused by varicella-zoster virus (VZV). This is the same virus that causes chickenpox. After a person is exposed to VZV, the virus stays in the body in an inactive (dormant) state. Shingles develops if the virus is reactivated. This can happen many years after the first (initial) exposure to VZV. It is not known what causes this virus to be reactivated. What increases the risk? People who have had chickenpox or received the chickenpox vaccine are at risk for shingles. Shingles infection is more common in people who:  Are older than age 81.  Have a weakened disease-fighting system (immune system), such as people with: ? HIV. ? AIDS. ? Cancer.  Are taking medicines that weaken the immune system, such as transplant medicines.  Are experiencing a lot of stress. What are the signs or symptoms? Early symptoms of this condition include itching, tingling, and pain in an area on your skin. Pain may be described as burning, stabbing, or throbbing. A few days or weeks after early symptoms start, a painful red rash appears. The rash is usually on one side of the body and has a band-like or belt-like pattern. The rash eventually turns into fluid-filled blisters that break  open, change into scabs, and dry up in about 2-3 weeks. At any time during the infection, you may also develop:  A fever.  Chills.  A headache.  An upset stomach. How is this diagnosed? This condition is diagnosed with a skin exam. Skin or fluid samples may be taken from the blisters before a diagnosis is made. These samples are examined under a microscope or sent to a lab for testing. How is this treated? The rash may last for several weeks. There is not a specific cure for this condition. Your health care provider will probably prescribe medicines to help you manage pain, recover more quickly, and avoid long-term problems. Medicines may include:  Antiviral drugs.  Anti-inflammatory drugs.  Pain medicines.  Anti-itching medicines (antihistamines). If the area involved is on your face, you may be referred to a specialist, such as an eye doctor (ophthalmologist) or an ear, nose, and throat (ENT) doctor (otolaryngologist) to help you avoid eye problems, chronic pain, or disability. Follow these instructions at home: Medicines  Take over-the-counter and prescription medicines only as told by your health care provider.  Apply an anti-itch cream or numbing cream to the affected area as told by your health care provider. Relieving itching and discomfort  Apply cold, wet cloths (cold compresses) to the area of the rash or blisters as told by your health care provider.  Cool baths can be soothing. Try adding baking soda or dry oatmeal to the water to reduce itching. Do not bathe in hot water.   Blister  and rash care  Keep your rash covered with a loose bandage (dressing). Wear loose-fitting clothing to help ease the pain of material rubbing against the rash.  Keep your rash and blisters clean by washing the area with mild soap and cool water as told by your health care provider.  Check your rash every day for signs of infection. Check for: ? More redness, swelling, or pain. ? Fluid  or blood. ? Warmth. ? Pus or a bad smell.  Do not scratch your rash or pick at your blisters. To help avoid scratching: ? Keep your fingernails clean and cut short. ? Wear gloves or mittens while you sleep, if scratching is a problem. General instructions  Rest as told by your health care provider.  Keep all follow-up visits as told by your health care provider. This is important.  Wash your hands often with soap and water. If soap and water are not available, use hand sanitizer. Doing this lowers your chance of getting a bacterial skin infection.  Before your blisters change into scabs, your shingles infection can cause chickenpox in people who have never had it or have never been vaccinated against it. To prevent this from happening, avoid contact with other people, especially: ? Babies. ? Pregnant women. ? Children who have eczema. ? Elderly people who have transplants. ? People who have chronic illnesses, such as cancer or AIDS. Contact a health care provider if:  Your pain is not relieved with prescribed medicines.  Your pain does not get better after the rash heals.  You have signs of infection in the rash area, such as: ? More redness, swelling, or pain around the rash. ? Fluid or blood coming from the rash. ? The rash area feeling warm to the touch. ? Pus or a bad smell coming from the rash. Get help right away if:  The rash is on your face or nose.  You have facial pain, pain around your eye area, or loss of feeling on one side of your face.  You have difficulty seeing.  You have ear pain or have ringing in your ear.  You have a loss of taste.  Your condition gets worse. Summary  Shingles, which is also known as herpes zoster, is an infection that causes a painful skin rash and fluid-filled blisters.  This condition is diagnosed with a skin exam. Skin or fluid samples may be taken from the blisters and examined before the diagnosis is made.  Keep your rash  covered with a loose bandage (dressing). Wear loose-fitting clothing to help ease the pain of material rubbing against the rash.  Before your blisters change into scabs, your shingles infection can cause chickenpox in people who have never had it or have never been vaccinated against it. This information is not intended to replace advice given to you by your health care provider. Make sure you discuss any questions you have with your health care provider. Document Revised: 07/16/2018 Document Reviewed: 11/26/2016 Elsevier Patient Education  2021 [employer].

## 2020-04-15 NOTE — Progress Notes (Signed)
Subjective:    Patient ID: Debra Le, female    DOB: [birth year], 45 y.o.   MRN: [id number]  HPI The patient is here for an acute visit.  Neck pain - she thought it was a kink in her neck.  It has persisted.  It started one week ago.  It is on the left side.  Maybe a little tingling.  Heat helped a little.  Massage helps some.  Advil/aleve did help.  No pain into arm.   She developed a rash about 4 days ago and it itches - she tried benadryl cream, cortisone cream.  Her nephew stayed with her and he had a healing ringworm.  She tried an antifugunal and it did not help. It is on her left upper chest and left side of neck.    Medications and allergies reviewed with patient and updated if appropriate.  Patient Active Problem List   Diagnosis Date Noted  . Iron deficiency 04/23/2018  . Verruca pedis 02/07/2018  . Hyperpigmented skin lesion 01/10/2018  . Skin abnormality 12/25/2017  . Other hyperlipidemia 03/23/2017  . Endometriosis 06/13/2016  . Depression 06/13/2016  . Diabetes ([place]) 09/20/2015  . Keloid 09/19/2015  . Lump of skin 09/19/2015  . Migraines 09/19/2015  . Essential hypertension, benign 03/09/2015  . GERD (gastroesophageal reflux disease) 03/09/2015  . Anxiety 03/09/2015  . Vitamin D deficiency 03/09/2015  . Morbid obesity ([place]) 03/09/2015    Current Outpatient Medications on File Prior to Visit  Medication Sig Dispense Refill  . amLODipine (NORVASC) 10 MG tablet TAKE 1 TABLET BY MOUTH EVERY DAY 90 tablet 1  . atorvastatin (LIPITOR) 20 MG tablet TAKE 1 TABLET BY MOUTH EVERY DAY 90 tablet 1  . blood glucose meter kit and supplies KIT Dispense based on patient and insurance preference. Use up to four times daily as directed. 1 each 0  . buPROPion (WELLBUTRIN SR) 100 MG 12 hr tablet Take 1 tablet (100 mg total) by mouth 2 (two) times daily. 180 tablet 1  . escitalopram (LEXAPRO) 10 MG tablet Take 1.5 tablets (15 mg total) by mouth daily. 135 tablet 1  .  esomeprazole (NEXIUM) 40 MG capsule Take 1 capsule (40 mg total) by mouth daily. 90 capsule 1  . ibuprofen (ADVIL) 800 MG tablet Take 800 mg by mouth 3 (three) times daily.    . INCASSIA 0.35 MG tablet Take 1 tablet by mouth daily.    . metFORMIN (GLUCOPHAGE) 500 MG tablet Take 1 tablet (500 mg total) by mouth 2 (two) times daily with a meal. 180 tablet 1  . metoprolol tartrate (LOPRESSOR) 50 MG tablet Take 1 tablet (50 mg total) by mouth 2 (two) times daily. 180 tablet 1  . Semaglutide, 1 MG/DOSE, (OZEMPIC, 1 MG/DOSE,) 2 MG/1.5ML SOPN Inject 0.75 mLs (1 mg total) into the skin once a week. 18 mL 1   No current facility-administered medications on file prior to visit.    Past Medical History:  Diagnosis Date  . Anxiety   . Depression   . Diabetes mellitus without complication ([place])   . GERD (gastroesophageal reflux disease)   . Hypertension   . Obesity     Past Surgical History:  Procedure Laterality Date  . CESAREAN SECTION  02/21/2005   with Tubal ligation    Social History   Socioeconomic History  . Marital status: Married    Spouse name: Not on file  . Number of children: 2  . Years of education: Not on file  .  Highest education level: Not on file  Occupational History  . Occupation: Therapist, sports  Tobacco Use  . Smoking status: Never Smoker  . Smokeless tobacco: Never Used  Substance and Sexual Activity  . Alcohol use: Yes    Alcohol/week: 7.0 standard drinks    Types: 7 Standard drinks or equivalent per week    Comment: red wine at night  . Drug use: No  . Sexual activity: Not on file  Other Topics Concern  . 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  Diabetes Mother   . Diabetes Maternal Aunt        all maternal side  . Diabetes Sister        x 2  . Hypertension Maternal Aunt        all maternal side  . Lymphoma Father   . Heart attack Maternal Aunt   . Dementia Maternal Aunt     Review of Systems  Constitutional: Negative for fatigue and fever.  Musculoskeletal: Positive for neck pain.  Skin: Positive for rash.  Neurological: Positive for headaches (mild yesterday).       Objective:   Vitals:   04/16/20 1041  BP: 132/84  Pulse: 82  Temp: 98.1 F (36.7 C)  SpO2: 99%   BP Readings from Last 3 Encounters:  04/16/20 132/84  11/25/19 136/82  05/20/19 (!) 146/92   Wt Readings from Last 3 Encounters:  04/16/20 (!) 323 lb 9.6 oz (146.8 kg)  11/25/19 (!) 319 lb (144.7 kg)  05/20/19 (!) 321 lb (145.6 kg)   Body mass index is 46.43 kg/m.   Physical Exam Constitutional:      General: She is not in acute distress.    Appearance: Normal appearance. She is not ill-appearing.  HENT:     Head: Normocephalic and atraumatic.  Skin:    General: Skin is warm and dry.     Findings: Rash (cluster of papules left upper chest next to sternum, another smaller cluster near lateral neck and a cluster posterior left neck) present.  Neurological:     Mental Status: She is alert.            Assessment & Plan:    See Problem List for Assessment and Plan of chronic medical problems.    This visit occurred during the SARS-CoV-2 public health emergency.  Safety protocols were in place, including screening questions prior to the visit, additional usage of staff PPE, and extensive cleaning of exam room while observing appropriate contact time as indicated for disinfecting solutions.

## 2020-04-16 ENCOUNTER — Encounter: Payer: Self-pay | Admitting: Internal Medicine

## 2020-04-16 ENCOUNTER — Other Ambulatory Visit: Payer: Self-pay

## 2020-04-16 ENCOUNTER — Ambulatory Visit: Payer: 59 | Admitting: Internal Medicine

## 2020-04-16 DIAGNOSIS — B029 Zoster without complications: Secondary | ICD-10-CM | POA: Diagnosis not present

## 2020-04-16 MED ORDER — TRIAMCINOLONE ACETONIDE 0.1 % EX CREA: 1.0000 "application " | TOPICAL_CREAM | Freq: Two times a day (BID) | CUTANEOUS | 0 refills | Status: DC

## 2020-04-16 MED ORDER — GABAPENTIN 100 MG PO CAPS: 100.0000 mg | ORAL_CAPSULE | Freq: Two times a day (BID) | ORAL | 3 refills | Status: DC

## 2020-04-16 MED ORDER — VALACYCLOVIR HCL 1 G PO TABS: 1000.0000 mg | ORAL_TABLET | Freq: Three times a day (TID) | ORAL | 0 refills | Status: DC

## 2020-04-16 NOTE — Assessment & Plan Note (Signed)
Acute Symptoms and exam c/w shingles - left neck to left chest Valtrex 1 gm TID x 1 week Triamcinolone cream 0.1% BID prn gabapentin 100 mg BID - discussed that we can increase if needed Will likely be out of work for one week  Will call with questions

## 2020-04-26 ENCOUNTER — Telehealth: Payer: Self-pay | Admitting: Internal Medicine

## 2020-04-26 ENCOUNTER — Other Ambulatory Visit: Payer: Self-pay

## 2020-04-26 MED ORDER — OZEMPIC (1 MG/DOSE) 2 MG/1.5ML ~~LOC~~ SOPN: 1.0000 mg | PEN_INJECTOR | SUBCUTANEOUS | 1 refills | Status: DC

## 2020-04-26 MED ORDER — ESCITALOPRAM OXALATE 10 MG PO TABS: 15.0000 mg | ORAL_TABLET | Freq: Every day | ORAL | 1 refills | Status: DC

## 2020-04-26 MED ORDER — ESOMEPRAZOLE MAGNESIUM 40 MG PO CPDR: 40.0000 mg | DELAYED_RELEASE_CAPSULE | Freq: Every day | ORAL | 1 refills | Status: DC

## 2020-04-26 MED ORDER — BUPROPION HCL ER (SR) 100 MG PO TB12: 100.0000 mg | ORAL_TABLET | Freq: Two times a day (BID) | ORAL | 1 refills | Status: DC

## 2020-04-26 MED ORDER — ATORVASTATIN CALCIUM 20 MG PO TABS: 20.0000 mg | ORAL_TABLET | Freq: Every day | ORAL | 1 refills | Status: DC

## 2020-04-26 MED ORDER — TRIAMCINOLONE ACETONIDE 0.1 % EX CREA: 1.0000 "application " | TOPICAL_CREAM | Freq: Two times a day (BID) | CUTANEOUS | 0 refills | Status: DC

## 2020-04-26 MED ORDER — METOPROLOL TARTRATE 50 MG PO TABS: 50.0000 mg | ORAL_TABLET | Freq: Two times a day (BID) | ORAL | 1 refills | Status: DC

## 2020-04-26 MED ORDER — AMLODIPINE BESYLATE 10 MG PO TABS: 10.0000 mg | ORAL_TABLET | Freq: Every day | ORAL | 1 refills | Status: DC

## 2020-04-26 MED ORDER — METFORMIN HCL 500 MG PO TABS: 500.0000 mg | ORAL_TABLET | Freq: Two times a day (BID) | ORAL | 1 refills | Status: DC

## 2020-04-26 NOTE — Telephone Encounter (Signed)
Patient states her insurance changed so she needs to go over all her medications and where they need to go, patient stated there was too many so she would rather go over it with Dr. Quay Burow assistant.  [phone]

## 2020-04-26 NOTE — Telephone Encounter (Signed)
Spoke with patient today. With her new insurance she can no longer [facility]. Asked to have pharmacy updated to [facility]. Updated pharmacy and refills sent.

## 2020-05-10 ENCOUNTER — Telehealth: Payer: Self-pay

## 2020-05-10 NOTE — Telephone Encounter (Signed)
Key: [id number]

## 2020-05-22 ENCOUNTER — Other Ambulatory Visit: Payer: Self-pay | Admitting: Internal Medicine

## 2020-05-26 ENCOUNTER — Other Ambulatory Visit: Payer: Self-pay | Admitting: Internal Medicine

## 2020-05-31 DIAGNOSIS — Z79899 Other long term (current) drug therapy: Secondary | ICD-10-CM | POA: Insufficient documentation

## 2020-05-31 NOTE — Patient Instructions (Addendum)
  Blood work was ordered.     Flu immunization administered today.     Medications changes include :   none    Please followup in 6 months  

## 2020-05-31 NOTE — Assessment & Plan Note (Addendum)
Treatment with metformin long term At risk for B12 def Check B12 level

## 2020-05-31 NOTE — Progress Notes (Signed)
Subjective:    Patient ID: Debra Le, female    DOB: October 29, 1975, 45 y.o.   MRN: [id number]  HPI The patient is here for follow up of their chronic medical problems, including DM, htn, hyperlipidemia, GERD, depression, anxiety, obesity  She is taking all of her medications as prescribed.    Rash in left axilla -  She tried triamcinolone - it helps but when she stops it comes back.  An anti-fungal cream helps.  It itches.  It has been there three weeks.   Going to the gym.  Will start working with a Clinical research associate.  She knows she needs to concentrate on weight loss.   On ocp's.  Has pain and is bleeding heavy.  She will likely need to have a hysterectomy.      Medications and allergies reviewed with patient and updated if appropriate.  Patient Active Problem List   Diagnosis Date Noted  . History of long-term treatment with high-risk medication 05/31/2020  . Shingles 04/16/2020  . Iron deficiency 04/23/2018  . Verruca pedis 02/07/2018  . Hyperpigmented skin lesion 01/10/2018  . Skin abnormality 12/25/2017  . Other hyperlipidemia 03/23/2017  . Endometriosis 06/13/2016  . Depression 06/13/2016  . Diabetes ([place]) 09/20/2015  . Keloid 09/19/2015  . Lump of skin 09/19/2015  . Migraines 09/19/2015  . Essential hypertension, benign 03/09/2015  . GERD (gastroesophageal reflux disease) 03/09/2015  . Anxiety 03/09/2015  . Vitamin D deficiency 03/09/2015  . Morbid obesity ([place]) 03/09/2015    Current Outpatient Medications on File Prior to Visit  Medication Sig Dispense Refill  . amLODipine (NORVASC) 10 MG tablet Take 1 tablet (10 mg total) by mouth daily. 90 tablet 1  . atorvastatin (LIPITOR) 20 MG tablet Take 1 tablet (20 mg total) by mouth daily. 90 tablet 1  . blood glucose meter kit and supplies KIT Dispense based on patient and insurance preference. Use up to four times daily as directed. 1 each 0  . buPROPion (WELLBUTRIN SR) 100 MG 12 hr tablet Take 1 tablet (100 mg total) by  mouth 2 (two) times daily. 180 tablet 1  . escitalopram (LEXAPRO) 10 MG tablet Take 1.5 tablets (15 mg total) by mouth daily. 135 tablet 1  . esomeprazole (NEXIUM) 40 MG capsule Take 1 capsule (40 mg total) by mouth daily. 90 capsule 1  . gabapentin (NEURONTIN) 100 MG capsule Take 1 capsule (100 mg total) by mouth 2 (two) times daily. 60 capsule 3  . ibuprofen (ADVIL) 800 MG tablet Take 800 mg by mouth 3 (three) times daily.    . INCASSIA 0.35 MG tablet Take 1 tablet by mouth daily.    . metFORMIN (GLUCOPHAGE) 500 MG tablet Take 1 tablet (500 mg total) by mouth 2 (two) times daily with a meal. 180 tablet 1  . metoprolol tartrate (LOPRESSOR) 50 MG tablet Take 1 tablet (50 mg total) by mouth 2 (two) times daily. 180 tablet 1  . Semaglutide, 1 MG/DOSE, (OZEMPIC, 1 MG/DOSE,) 2 MG/1.5ML SOPN Inject 1 mg into the skin once a week. 18 mL 1  . triamcinolone (KENALOG) 0.1 % Apply 1 application topically 2 (two) times daily. 30 g 0  . OZEMPIC, 1 MG/DOSE, 4 MG/3ML SOPN INJECT 1 MG INTO THE SKIN ONCE WEEKLY. (Patient not taking: Reported on 06/01/2020)     No current facility-administered medications on file prior to visit.    Past Medical History:  Diagnosis Date  . Anxiety   . Depression   . Diabetes mellitus without complication (  [facility])   . GERD (gastroesophageal reflux disease)   . Hypertension   . Obesity     Past Surgical History:  Procedure Laterality Date  . CESAREAN SECTION  02/21/2005   with Tubal ligation    Social History   Socioeconomic History  . Marital status: Married    Spouse name: Not on file  . Number of children: 2  . Years of education: Not on file  . Highest education level: Not on file  Occupational History  . Occupation: Therapist, sports  Tobacco Use  . Smoking status: Never Smoker  . Smokeless tobacco: Never Used  Substance and Sexual Activity  . Alcohol use: Yes    Alcohol/week: 7.0 standard drinks    Types: 7 Standard drinks or equivalent per week    Comment: red wine  at night  . Drug use: No  . Sexual activity: Not on file  Other Topics Concern  . 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 Diabetes Mother   . Diabetes Maternal Aunt        all maternal side  . Diabetes Sister        x 2  . Hypertension Maternal Aunt        all maternal side  . Lymphoma Father   . Heart attack Maternal Aunt   . Dementia Maternal Aunt     Review of Systems  Constitutional: Negative for chills and fever.  Respiratory: Negative for cough, shortness of breath and wheezing.   Cardiovascular: Negative for chest pain, palpitations and leg swelling.  Skin: Negative for rash.  Neurological: Negative for light-headedness and headaches.       Objective:   Vitals:   06/01/20 0920  BP: 124/80  Pulse: 72  Temp: 98.1 F (36.7 C)  SpO2: 99%   BP Readings from Last 3 Encounters:  06/01/20 124/80  04/16/20 132/84  11/25/19 136/82   Wt Readings from Last 3 Encounters:  06/01/20 (!) 321 lb (145.6 kg)  04/16/20 (!) 323 lb 9.6 oz (146.8 kg)  11/25/19 (!) 319 lb (144.7 kg)   Body mass index is 46.06 kg/m.   Physical Exam    Constitutional: Appears well-developed and well-nourished. No distress.  HENT:  Head: Normocephalic and atraumatic.  Neck: Neck supple. No tracheal deviation present. No thyromegaly present.  No cervical lymphadenopathy Cardiovascular: Normal rate, regular rhythm and normal heart sounds.   No murmur heard. No carotid bruit .  No edema Pulmonary/Chest: Effort normal and breath sounds normal. No respiratory distress. No has no wheezes. No rales.  Skin: Skin is warm and dry. Not diaphoretic. Rash in left axilla - scaly and  dry Psychiatric: Normal mood and affect. Behavior is normal.      Assessment & Plan:    See Problem List for Assessment and Plan of chronic medical problems.    This visit occurred during the SARS-CoV-2 public health emergency.  Safety protocols were in place, including screening questions prior to the visit, additional usage of staff PPE, and extensive cleaning of exam room while observing appropriate contact time as indicated for disinfecting solutions.

## 2020-06-01 ENCOUNTER — Encounter: Payer: Self-pay | Admitting: Internal Medicine

## 2020-06-01 ENCOUNTER — Ambulatory Visit: Payer: 59 | Admitting: Internal Medicine

## 2020-06-01 ENCOUNTER — Other Ambulatory Visit: Payer: Self-pay

## 2020-06-01 VITALS — BP 124/80 | HR 72 | Temp 98.1°F | Ht 70.0 in | Wt 321.0 lb

## 2020-06-01 DIAGNOSIS — F419 Anxiety disorder, unspecified: Secondary | ICD-10-CM

## 2020-06-01 DIAGNOSIS — E1169 Type 2 diabetes mellitus with other specified complication: Secondary | ICD-10-CM | POA: Diagnosis not present

## 2020-06-01 DIAGNOSIS — Z79899 Other long term (current) drug therapy: Secondary | ICD-10-CM

## 2020-06-01 DIAGNOSIS — I10 Essential (primary) hypertension: Secondary | ICD-10-CM

## 2020-06-01 DIAGNOSIS — E7849 Other hyperlipidemia: Secondary | ICD-10-CM | POA: Diagnosis not present

## 2020-06-01 DIAGNOSIS — Z1159 Encounter for screening for other viral diseases: Secondary | ICD-10-CM

## 2020-06-01 DIAGNOSIS — K219 Gastro-esophageal reflux disease without esophagitis: Secondary | ICD-10-CM | POA: Diagnosis not present

## 2020-06-01 DIAGNOSIS — F3289 Other specified depressive episodes: Secondary | ICD-10-CM

## 2020-06-01 DIAGNOSIS — E611 Iron deficiency: Secondary | ICD-10-CM

## 2020-06-01 LAB — COMPREHENSIVE METABOLIC PANEL
ALT: 20 U/L (ref 0–35)
AST: 19 U/L (ref 0–37)
Albumin: 4.1 g/dL (ref 3.5–5.2)
Alkaline Phosphatase: 146 U/L — ABNORMAL HIGH (ref 39–117)
BUN: 8 mg/dL (ref 6–23)
CO2: 28 mEq/L (ref 19–32)
Calcium: 9.4 mg/dL (ref 8.4–10.5)
Chloride: 104 mEq/L (ref 96–112)
Creatinine, Ser: 0.95 mg/dL (ref 0.40–1.20)
GFR: 73.06 mL/min (ref >60.00)
Glucose, Bld: 94 mg/dL (ref 70–99)
Potassium: 4.5 mEq/L (ref 3.5–5.1)
Sodium: 138 mEq/L (ref 135–145)
Total Bilirubin: 0.4 mg/dL (ref 0.2–1.2)
Total Protein: 7.6 g/dL (ref 6.0–8.3)

## 2020-06-01 LAB — LIPID PANEL
Cholesterol: 126 mg/dL (ref 0–200)
HDL: 33.6 mg/dL — ABNORMAL LOW (ref >39.00)
LDL Cholesterol: 82 mg/dL (ref 0–99)
NonHDL: 92.44
Total CHOL/HDL Ratio: 4
Triglycerides: 54 mg/dL (ref 0.0–149.0)
VLDL: 10.8 mg/dL (ref 0.0–40.0)

## 2020-06-01 LAB — CBC WITH DIFFERENTIAL/PLATELET
Basophils Absolute: 0 10*3/uL (ref 0.0–0.1)
Basophils Relative: 0.5 % (ref 0.0–3.0)
Eosinophils Absolute: 0.1 10*3/uL (ref 0.0–0.7)
Eosinophils Relative: 1.1 % (ref 0.0–5.0)
HCT: 35.4 % — ABNORMAL LOW (ref 36.0–46.0)
Hemoglobin: 11.5 g/dL — ABNORMAL LOW (ref 12.0–15.0)
Lymphocytes Relative: 35.3 % (ref 12.0–46.0)
Lymphs Abs: 2.4 10*3/uL (ref 0.7–4.0)
MCHC: 32.6 g/dL (ref 30.0–36.0)
MCV: 86.3 fl (ref 78.0–100.0)
Monocytes Absolute: 0.3 10*3/uL (ref 0.1–1.0)
Monocytes Relative: 4.8 % (ref 3.0–12.0)
Neutro Abs: 4 10*3/uL (ref 1.4–7.7)
Neutrophils Relative %: 58.3 % (ref 43.0–77.0)
Platelets: 381 10*3/uL (ref 150.0–400.0)
RBC: 4.1 Mil/uL (ref 3.87–5.11)
RDW: 15.3 % (ref 11.5–15.5)
WBC: 6.8 10*3/uL (ref 4.0–10.5)

## 2020-06-01 LAB — HEMOGLOBIN A1C: Hgb A1c MFr Bld: 6.1 % (ref 4.6–6.5)

## 2020-06-01 LAB — VITAMIN B12: Vitamin B-12: 1287 pg/mL — ABNORMAL HIGH (ref 211–911)

## 2020-06-01 NOTE — Assessment & Plan Note (Addendum)
Chronic Related to heavy menses Cbc, iron panel today Will have a hysterectomy most likely next couple of months

## 2020-06-01 NOTE — Assessment & Plan Note (Signed)
Chronic GERD controlled Continue nexium 40 mg daily  

## 2020-06-01 NOTE — Assessment & Plan Note (Signed)
Chronic Check lipid panel  Continue atorvastatin 20 mg  Regular exercise and healthy diet encouraged

## 2020-06-01 NOTE — Assessment & Plan Note (Signed)
Chronic Controlled, stable Continue lexapro 10 mg daily, bupropion 100 mg BID

## 2020-06-01 NOTE — Assessment & Plan Note (Addendum)
chronic Lab Results  Component Value Date   HGBA1C 5.9 (H) 11/25/2019   Check a1c Low sugar / carb diet Stressed regular exercise Work on weight loss Continue Metformin 500 mg twice daily, Ozempic 1 mg weekly

## 2020-06-01 NOTE — Assessment & Plan Note (Signed)
Chronic She is exercising and will be starting the new gym soon and will start working with a trainer on a regular basis Decrease portions, increase protein, vegetables, low sugar/carbohydrate diet She is actively working on weight loss

## 2020-06-01 NOTE — Assessment & Plan Note (Signed)
Chronic BP well controlled Continue amlodipine 10 mg daily, metoprolol 50 mg BID cmp

## 2020-06-04 LAB — IRON,TIBC AND FERRITIN PANEL
%SAT: 13 % (calc) — ABNORMAL LOW (ref 16–45)
Ferritin: 8 ng/mL — ABNORMAL LOW (ref 16–232)
Iron: 43 ug/dL (ref 40–190)
TIBC: 320 mcg/dL (calc) (ref 250–450)

## 2020-06-04 LAB — HEPATITIS C ANTIBODY
Hepatitis C Ab: NONREACTIVE
SIGNAL TO CUT-OFF: 0.01 (ref <1.00)

## 2020-07-13 ENCOUNTER — Telehealth: Payer: Self-pay | Admitting: Internal Medicine

## 2020-07-13 MED ORDER — TRIAMCINOLONE ACETONIDE 0.1 % EX CREA: 1.0000 "application " | TOPICAL_CREAM | Freq: Two times a day (BID) | CUTANEOUS | 2 refills | Status: DC

## 2020-07-13 NOTE — Telephone Encounter (Signed)
Patient was seen on 02.25.22 and was given some  triamcinolone (KENALOG) 0.1 % to try for the rash on her arm and patient states she has been using it but its still there so she was wondering if there was something else that can be sent in for her.  [facility] [place], [place] AT [address] Phone:  [phone]  Fax:  [phone]

## 2020-08-15 ENCOUNTER — Telehealth: Payer: Self-pay | Admitting: Internal Medicine

## 2020-08-15 NOTE — Telephone Encounter (Signed)
Patient called and said that she is having surgery on 5/25. She is needing some information to be sent over to her GYN. She did not specify what kind of information.  She is requesting a call back. She can be reached at [phone]. Please advise

## 2020-08-16 NOTE — Telephone Encounter (Signed)
Letter written and printed.

## 2020-08-17 NOTE — Telephone Encounter (Signed)
Letter faxed and patient contacted.

## 2020-08-22 ENCOUNTER — Encounter (HOSPITAL_BASED_OUTPATIENT_CLINIC_OR_DEPARTMENT_OTHER): Payer: Self-pay | Admitting: Obstetrics and Gynecology

## 2020-08-22 ENCOUNTER — Other Ambulatory Visit: Payer: Self-pay

## 2020-08-22 NOTE — Progress Notes (Signed)
Spoke w/ via phone for pre-op interview---pt Lab needs dos---urine preg-               Lab results------lab appt 08-27-2020 1300 for cbc cmp t & s and ekg COVID test ----possible overnight stay 08-27-2020 1445 Arrive at -------530 am 08-29-2020 NPO after MN NO Solid Food.  Clear liquids from MN until---430 am then npo Med rec completed Medications to take morning of surgery -----amlodipine, lipitor, bupropion, escitalopram, nexium, metoprolol tartrate Diabetic medication ----- Patient instructed to bring photo id and insurance card day of surgery Patient aware to have Driver (ride ) / caregiver   Ernie Hew spouse will stay  for 24 hours after surgery  Patient Special Instructions -----pt given possible overnight stay instructions Pre-Op special Istructions -----none Patient verbalized understanding of instructions that were given at this phone interview. Patient denies shortness of breath, chest pain, fever, cough at this phone interview.  Medical clearance note dr Marzetta Board burns 08-16-2020 on chart for 08-29-2020 surgery  Spoke with adam hodierene mda and reviewed pt history and bmi 45.34, pt ok for [facility] per dr Quita Skye hodierne mda.

## 2020-08-22 NOTE — Progress Notes (Signed)
YOU ARE SCHEDULED FOR A COVID TEST 08-27-2020 at 245 PM. THIS TEST MUST BE DONE BEFORE SURGERY. GO TO  [facility] [place], [place], IT IS APPROXIMATELY 2 MINUTES PAST [employer] ON THE RIGHT AND REMAIN IN YOUR CAR, THIS IS A DRIVE UP TEST.      Your procedure is scheduled on 08-29-2020  Report to [facility].   Call this number if you have problems the morning of surgery  :[phone].   OUR ADDRESS IS [place].  WE ARE LOCATED IN [facility].  PLEASE BRING YOUR INSURANCE CARD AND PHOTO ID DAY OF SURGERY.  ONLY ONE PERSON ALLOWED IN FACILITY WAITING AREA.                                     REMEMBER:  DO NOT EAT FOOD, CANDY GUM OR MINTS  AFTER MIDNIGHT . YOU MAY HAVE CLEAR LIQUIDS FROM MIDNIGHT UNTIL  430 AM. NO CLEAR LIQUIDS AFTER 430 AM DAY OF SURGERY.   YOU MAY  BRUSH YOUR TEETH MORNING OF SURGERY AND RINSE YOUR MOUTH OUT, NO CHEWING GUM CANDY OR MINTS.    CLEAR LIQUID DIET   Foods Allowed                                                                     Foods Excluded  Coffee and tea, regular and decaf                             liquids that you cannot  Plain Jell-O any favor except red or purple                                           see through such as: Fruit ices (not with fruit pulp)                                     milk, soups, orange juice  Iced Popsicles                                    All solid food Carbonated beverages, regular and diet                                    Cranberry, grape and apple juices Sports drinks like Gatorade Lightly seasoned clear broth or consume(fat free) Sugar, honey syrup  Sample Menu Breakfast                                Lunch  Supper Cranberry juice                    Beef broth                            Chicken broth Jell-O                                     Grape juice                           Apple  juice Coffee or tea                        Jell-O                                      Popsicle                                                Coffee or tea                        Coffee or tea  _____________________________________________________________________     TAKE THESE MEDICATIONS MORNING OF SURGERY WITH A SIP OF WATER: AMLODIPINE,  BUPROPION, ESCITALOPRAM, NEXIUM, METOPROLOL TARTRATE.  DO NOT TAKE YOUR METFORMIN DAY OF SURGERY.  ONE VISITOR IS ALLOWED IN WAITING ROOM ONLY DAY OF SURGERY.  NO VISITOR MAY SPEND THE NIGHT.  VISITOR ARE ALLOWED TO STAY UNTIL 800 PM.                                    DO NOT WEAR JEWERLY, MAKE UP. DO NOT WEAR LOTIONS, POWDERS, PERFUMES OR DEODORANT. DO NOT SHAVE FOR 24 HOURS PRIOR TO DAY OF SURGERY. MEN MAY SHAVE FACE AND NECK. CONTACTS, GLASSES, OR DENTURES MAY NOT BE WORN TO SURGERY.                                    [facility] IS NOT RESPONSIBLE  FOR ANY BELONGINGS.                                                                    Marland Kitchen           [facility] - Preparing for Surgery Before surgery, you can play an important role.  Because skin is not sterile, your skin needs to be as free of germs as possible.  You can reduce the number of germs on your skin by washing with CHG (chlorahexidine gluconate) soap before surgery.  CHG is an antiseptic cleaner which kills germs and bonds with the skin to continue killing germs even after washing. Please DO NOT use if you have  an allergy to CHG or antibacterial soaps.  If your skin becomes reddened/irritated stop using the CHG and inform your nurse when you arrive at Short Stay. Do not shave (including legs and underarms) for at least 48 hours prior to the first CHG shower.  You may shave your face/neck. Please follow these instructions carefully:  1.  Shower with CHG Soap the night before surgery and the  morning of Surgery.  2.  If you choose to wash your hair, wash your hair first as usual with your   normal  shampoo.  3.  After you shampoo, rinse your hair and body thoroughly to remove the  shampoo.                            4.  Use CHG as you would any other liquid soap.  You can apply chg directly  to the skin and wash                      Gently with a scrungie or clean washcloth.  5.  Apply the CHG Soap to your body ONLY FROM THE NECK DOWN.   Do not use on face/ open                           Wound or open sores. Avoid contact with eyes, ears mouth and genitals (private parts).                       Wash face,  Genitals (private parts) with your normal soap.             6.  Wash thoroughly, paying special attention to the area where your surgery  will be performed.  7.  Thoroughly rinse your body with warm water from the neck down.  8.  DO NOT shower/wash with your normal soap after using and rinsing off  the CHG Soap.                9.  Pat yourself dry with a clean towel.            10.  Wear clean pajamas.            11.  Place clean sheets on your bed the night of your first shower and do not  sleep with pets. Day of Surgery : Do not apply any lotions/deodorants the morning of surgery.  Please wear clean clothes to the hospital/surgery center.  FAILURE TO FOLLOW THESE INSTRUCTIONS MAY RESULT IN THE CANCELLATION OF YOUR SURGERY PATIENT SIGNATURE_________________________________  NURSE SIGNATURE__________________________________  ________________________________________________________________________                                                        QUESTIONS Debra Le PRE OP NURSE PHONE [phone]

## 2020-08-27 ENCOUNTER — Other Ambulatory Visit (HOSPITAL_COMMUNITY): Payer: 59

## 2020-08-27 ENCOUNTER — Encounter (HOSPITAL_COMMUNITY)
Admission: RE | Admit: 2020-08-27 | Discharge: 2020-08-27 | Disposition: A | Discharge status: Home / Self Care | Payer: 59 | Source: Ambulatory Visit | Attending: Obstetrics and Gynecology | Admitting: Obstetrics and Gynecology

## 2020-08-27 DIAGNOSIS — Z01818 Encounter for other preprocedural examination: Secondary | ICD-10-CM | POA: Insufficient documentation

## 2020-08-27 LAB — CBC
HCT: 34.9 % — ABNORMAL LOW (ref 36.0–46.0)
Hemoglobin: 11.2 g/dL — ABNORMAL LOW (ref 12.0–15.0)
MCH: 28.1 pg (ref 26.0–34.0)
MCHC: 32.1 g/dL (ref 30.0–36.0)
MCV: 87.7 fL (ref 80.0–100.0)
Platelets: 380 10*3/uL (ref 150–400)
RBC: 3.98 MIL/uL (ref 3.87–5.11)
RDW: 14.6 % (ref 11.5–15.5)
WBC: 8.5 10*3/uL (ref 4.0–10.5)
nRBC: 0 % (ref 0.0–0.2)

## 2020-08-27 LAB — COMPREHENSIVE METABOLIC PANEL
ALT: 41 U/L (ref 0–44)
AST: 31 U/L (ref 15–41)
Albumin: 3.9 g/dL (ref 3.5–5.0)
Alkaline Phosphatase: 145 U/L — ABNORMAL HIGH (ref 38–126)
Anion gap: 6 (ref 5–15)
BUN: 10 mg/dL (ref 6–20)
CO2: 27 mmol/L (ref 22–32)
Calcium: 9.1 mg/dL (ref 8.9–10.3)
Chloride: 105 mmol/L (ref 98–111)
Creatinine, Ser: 0.78 mg/dL (ref 0.44–1.00)
GFR, Estimated: >60 mL/min (ref >60)
Glucose, Bld: 106 mg/dL — ABNORMAL HIGH (ref 70–99)
Potassium: 4.3 mmol/L (ref 3.5–5.1)
Sodium: 138 mmol/L (ref 135–145)
Total Bilirubin: 0.4 mg/dL (ref 0.3–1.2)
Total Protein: 8.2 g/dL — ABNORMAL HIGH (ref 6.5–8.1)

## 2020-08-29 NOTE — Progress Notes (Addendum)
Spoke w/ via phone for pre-op interview---pt Lab needs dos---urine preg-               Lab results-----CBC CMP T & S AND EKG ALL DONE 08-27-2020 EPIC COVID test ----possible overnight stay  09-04-2020 845 am Arrive at -------815 AM 09-05-2020 NPO after MN NO Solid Food.  Clear liquids from MN until---715 am then npo Med rec completed Medications to take morning of surgery -----amlodipine, lipitor, bupropion, escitalopram, nexium, metoprolol tartrate Diabetic medication -----none day of surgery Patient instructed to bring photo id and insurance card day of surgery Patient aware to have Driver (ride ) / caregiver   Ernie Hew spouse will stay  for 24 hours after surgery  Patient Special Instructions -----pt given possible overnight stay instructions Pre-Op special Istructions -----none Patient verbalized understanding of instructions that were given at this phone interview. Patient denies shortness of breath, chest pain, fever, cough at this phone interview.  Medical clearance note dr Marzetta Board burns 08-16-2020 on chart for 6-1--2022 surgery  Spoke with adam hodierene mda and reviewed pt history and bmi 45.34, pt ok for [facility] per dr Quita Skye hodierne mda.

## 2020-09-04 ENCOUNTER — Other Ambulatory Visit (HOSPITAL_COMMUNITY)
Admission: RE | Admit: 2020-09-04 | Discharge: 2020-09-04 | Disposition: A | Discharge status: Home / Self Care | Payer: 59 | Source: Ambulatory Visit | Attending: Obstetrics and Gynecology | Admitting: Obstetrics and Gynecology

## 2020-09-04 DIAGNOSIS — Z01812 Encounter for preprocedural laboratory examination: Secondary | ICD-10-CM | POA: Diagnosis present

## 2020-09-04 DIAGNOSIS — Z20822 Contact with and (suspected) exposure to covid-19: Secondary | ICD-10-CM | POA: Diagnosis not present

## 2020-09-04 LAB — SARS CORONAVIRUS 2 (TAT 6-24 HRS): SARS Coronavirus 2: NEGATIVE

## 2020-09-05 ENCOUNTER — Encounter (HOSPITAL_BASED_OUTPATIENT_CLINIC_OR_DEPARTMENT_OTHER)
Admission: RE | Disposition: A | Discharge status: Home / Self Care | Payer: Self-pay | Source: Home / Self Care | Attending: Obstetrics and Gynecology

## 2020-09-05 ENCOUNTER — Encounter (HOSPITAL_BASED_OUTPATIENT_CLINIC_OR_DEPARTMENT_OTHER): Payer: Self-pay | Admitting: Obstetrics and Gynecology

## 2020-09-05 ENCOUNTER — Ambulatory Visit (HOSPITAL_BASED_OUTPATIENT_CLINIC_OR_DEPARTMENT_OTHER)
Admission: RE | Admit: 2020-09-05 | Discharge: 2020-09-05 | Disposition: A | Discharge status: Home / Self Care | Payer: 59 | Attending: Obstetrics and Gynecology | Admitting: Obstetrics and Gynecology

## 2020-09-05 ENCOUNTER — Ambulatory Visit (HOSPITAL_BASED_OUTPATIENT_CLINIC_OR_DEPARTMENT_OTHER): Payer: 59 | Admitting: Anesthesiology

## 2020-09-05 DIAGNOSIS — N838 Other noninflammatory disorders of ovary, fallopian tube and broad ligament: Secondary | ICD-10-CM | POA: Diagnosis not present

## 2020-09-05 DIAGNOSIS — Z8616 Personal history of COVID-19: Secondary | ICD-10-CM | POA: Diagnosis not present

## 2020-09-05 DIAGNOSIS — N888 Other specified noninflammatory disorders of cervix uteri: Secondary | ICD-10-CM | POA: Insufficient documentation

## 2020-09-05 DIAGNOSIS — E119 Type 2 diabetes mellitus without complications: Secondary | ICD-10-CM | POA: Insufficient documentation

## 2020-09-05 DIAGNOSIS — Z6841 Body Mass Index (BMI) 40.0 and over, adult: Secondary | ICD-10-CM | POA: Insufficient documentation

## 2020-09-05 DIAGNOSIS — Z794 Long term (current) use of insulin: Secondary | ICD-10-CM | POA: Insufficient documentation

## 2020-09-05 DIAGNOSIS — D259 Leiomyoma of uterus, unspecified: Secondary | ICD-10-CM | POA: Diagnosis present

## 2020-09-05 DIAGNOSIS — N809 Endometriosis, unspecified: Secondary | ICD-10-CM | POA: Insufficient documentation

## 2020-09-05 HISTORY — DX: Cardiac murmur, unspecified: R01.1

## 2020-09-05 HISTORY — DX: Type 2 diabetes mellitus without complications: E11.9

## 2020-09-05 HISTORY — DX: Presence of spectacles and contact lenses: Z97.3

## 2020-09-05 HISTORY — DX: Abnormal uterine and vaginal bleeding, unspecified: N93.9

## 2020-09-05 HISTORY — DX: Palpitations: R00.2

## 2020-09-05 HISTORY — PX: CYSTOSCOPY: SHX5120

## 2020-09-05 HISTORY — PX: TOTAL LAPAROSCOPIC HYSTERECTOMY WITH SALPINGECTOMY: SHX6742

## 2020-09-05 LAB — ABO/RH: ABO/RH(D): A POS

## 2020-09-05 LAB — TYPE AND SCREEN
ABO/RH(D): A POS
Antibody Screen: NEGATIVE

## 2020-09-05 LAB — POCT PREGNANCY, URINE: Preg Test, Ur: NEGATIVE

## 2020-09-05 LAB — GLUCOSE, CAPILLARY: Glucose-Capillary: 122 mg/dL — ABNORMAL HIGH (ref 70–99)

## 2020-09-05 SURGERY — HYSTERECTOMY, TOTAL, LAPAROSCOPIC, WITH SALPINGECTOMY
Anesthesia: General | Site: Bladder

## 2020-09-05 MED ORDER — POVIDONE-IODINE 10 % EX SWAB: 2.0000 "application " | Freq: Once | CUTANEOUS | Status: DC

## 2020-09-05 MED ORDER — PROMETHAZINE HCL 25 MG/ML IJ SOLN: 6.2500 mg | INTRAMUSCULAR | Status: DC | PRN

## 2020-09-05 MED ORDER — BUPIVACAINE HCL 0.5 % IJ SOLN
INTRAMUSCULAR | Status: DC | PRN
  Administered 2020-09-05: 10 mL

## 2020-09-05 MED ORDER — FENTANYL CITRATE (PF) 100 MCG/2ML IJ SOLN
INTRAMUSCULAR | Status: DC | PRN
  Administered 2020-09-05: 100 ug via INTRAVENOUS

## 2020-09-05 MED ORDER — ACETAMINOPHEN 500 MG PO TABS
ORAL_TABLET | ORAL | Status: AC
  Filled 2020-09-05: qty 2

## 2020-09-05 MED ORDER — LIDOCAINE 2% (20 MG/ML) 5 ML SYRINGE
INTRAMUSCULAR | Status: DC | PRN
  Administered 2020-09-05: 100 mg via INTRAVENOUS

## 2020-09-05 MED ORDER — PROPOFOL 10 MG/ML IV BOLUS
INTRAVENOUS | Status: DC | PRN
  Administered 2020-09-05: 150 mg via INTRAVENOUS

## 2020-09-05 MED ORDER — OXYCODONE HCL 5 MG PO TABS
5.0000 mg | ORAL_TABLET | ORAL | Status: DC | PRN
  Administered 2020-09-05: 5 mg via ORAL

## 2020-09-05 MED ORDER — AMISULPRIDE (ANTIEMETIC) 5 MG/2ML IV SOLN: 10.0000 mg | Freq: Once | INTRAVENOUS | Status: DC | PRN

## 2020-09-05 MED ORDER — OXYCODONE HCL 5 MG PO TABS: 5.0000 mg | ORAL_TABLET | ORAL | 0 refills | Status: DC | PRN

## 2020-09-05 MED ORDER — LIDOCAINE 2% (20 MG/ML) 5 ML SYRINGE
INTRAMUSCULAR | Status: AC
  Filled 2020-09-05: qty 5

## 2020-09-05 MED ORDER — IBUPROFEN 200 MG PO TABS: 600.0000 mg | ORAL_TABLET | Freq: Four times a day (QID) | ORAL | Status: DC

## 2020-09-05 MED ORDER — DEXAMETHASONE SODIUM PHOSPHATE 10 MG/ML IJ SOLN
INTRAMUSCULAR | Status: AC
  Filled 2020-09-05: qty 1

## 2020-09-05 MED ORDER — KETAMINE HCL 10 MG/ML IJ SOLN
INTRAMUSCULAR | Status: DC | PRN
  Administered 2020-09-05: 10 mg via INTRAVENOUS
  Administered 2020-09-05: 20 mg via INTRAVENOUS
  Administered 2020-09-05 (×2): 10 mg via INTRAVENOUS

## 2020-09-05 MED ORDER — KETOROLAC TROMETHAMINE 30 MG/ML IJ SOLN
INTRAMUSCULAR | Status: AC
  Filled 2020-09-05: qty 1

## 2020-09-05 MED ORDER — CEFAZOLIN IN SODIUM CHLORIDE 3-0.9 GM/100ML-% IV SOLN
3.0000 g | INTRAVENOUS | Status: AC
  Administered 2020-09-05: 3 g via INTRAVENOUS

## 2020-09-05 MED ORDER — SCOPOLAMINE 1 MG/3DAYS TD PT72
1.0000 | MEDICATED_PATCH | TRANSDERMAL | Status: DC
  Administered 2020-09-05: 1.5 mg via TRANSDERMAL

## 2020-09-05 MED ORDER — ROCURONIUM BROMIDE 10 MG/ML (PF) SYRINGE
PREFILLED_SYRINGE | INTRAVENOUS | Status: AC
  Filled 2020-09-05: qty 10

## 2020-09-05 MED ORDER — KETOROLAC TROMETHAMINE 30 MG/ML IJ SOLN: 30.0000 mg | Freq: Once | INTRAMUSCULAR | Status: DC

## 2020-09-05 MED ORDER — SODIUM CHLORIDE 0.9 % IR SOLN
Status: DC | PRN
  Administered 2020-09-05: 250 mL

## 2020-09-05 MED ORDER — LIDOCAINE HCL 2 % IJ SOLN
INTRAMUSCULAR | Status: AC
  Filled 2020-09-05: qty 20

## 2020-09-05 MED ORDER — PHENYLEPHRINE 40 MCG/ML (10ML) SYRINGE FOR IV PUSH (FOR BLOOD PRESSURE SUPPORT)
PREFILLED_SYRINGE | INTRAVENOUS | Status: AC
  Filled 2020-09-05: qty 10

## 2020-09-05 MED ORDER — OXYCODONE HCL 5 MG PO TABS
ORAL_TABLET | ORAL | Status: AC
  Filled 2020-09-05: qty 1

## 2020-09-05 MED ORDER — KETOROLAC TROMETHAMINE 30 MG/ML IJ SOLN: 30.0000 mg | Freq: Four times a day (QID) | INTRAMUSCULAR | Status: DC

## 2020-09-05 MED ORDER — ONDANSETRON HCL 4 MG/2ML IJ SOLN
INTRAMUSCULAR | Status: DC | PRN
  Administered 2020-09-05: 4 mg via INTRAVENOUS

## 2020-09-05 MED ORDER — ONDANSETRON HCL 4 MG/2ML IJ SOLN
INTRAMUSCULAR | Status: AC
  Filled 2020-09-05: qty 2

## 2020-09-05 MED ORDER — SCOPOLAMINE 1 MG/3DAYS TD PT72
MEDICATED_PATCH | TRANSDERMAL | Status: AC
  Filled 2020-09-05: qty 1

## 2020-09-05 MED ORDER — LIDOCAINE 2% (20 MG/ML) 5 ML SYRINGE: INTRAMUSCULAR | Status: DC | PRN

## 2020-09-05 MED ORDER — ACETAMINOPHEN 500 MG PO TABS
1000.0000 mg | ORAL_TABLET | ORAL | Status: AC
  Administered 2020-09-05: 1000 mg via ORAL

## 2020-09-05 MED ORDER — ROCURONIUM BROMIDE 10 MG/ML (PF) SYRINGE
PREFILLED_SYRINGE | INTRAVENOUS | Status: DC | PRN
  Administered 2020-09-05: 10 mg via INTRAVENOUS
  Administered 2020-09-05 (×2): 20 mg via INTRAVENOUS
  Administered 2020-09-05: 100 mg via INTRAVENOUS

## 2020-09-05 MED ORDER — MENTHOL 3 MG MT LOZG: 1.0000 | LOZENGE | OROMUCOSAL | Status: DC | PRN

## 2020-09-05 MED ORDER — BISACODYL 10 MG RE SUPP: 10.0000 mg | Freq: Every day | RECTAL | Status: DC | PRN

## 2020-09-05 MED ORDER — PHENYLEPHRINE HCL (PRESSORS) 10 MG/ML IV SOLN
INTRAVENOUS | Status: DC | PRN
  Administered 2020-09-05: 120 ug via INTRAVENOUS
  Administered 2020-09-05: 80 ug via INTRAVENOUS

## 2020-09-05 MED ORDER — GABAPENTIN 300 MG PO CAPS: 300.0000 mg | ORAL_CAPSULE | Freq: Three times a day (TID) | ORAL | 0 refills | Status: DC

## 2020-09-05 MED ORDER — IBUPROFEN 600 MG PO TABS: 600.0000 mg | ORAL_TABLET | Freq: Four times a day (QID) | ORAL | 0 refills | Status: DC

## 2020-09-05 MED ORDER — GABAPENTIN 300 MG PO CAPS
ORAL_CAPSULE | ORAL | Status: AC
  Filled 2020-09-05: qty 1

## 2020-09-05 MED ORDER — GABAPENTIN 300 MG PO CAPS
300.0000 mg | ORAL_CAPSULE | Freq: Three times a day (TID) | ORAL | Status: DC
  Administered 2020-09-05: 300 mg via ORAL

## 2020-09-05 MED ORDER — GABAPENTIN 300 MG PO CAPS: 300.0000 mg | ORAL_CAPSULE | ORAL | Status: DC

## 2020-09-05 MED ORDER — SODIUM CHLORIDE 0.9 % IR SOLN
Status: DC | PRN
  Administered 2020-09-05: 1000 mL

## 2020-09-05 MED ORDER — KETOROLAC TROMETHAMINE 30 MG/ML IJ SOLN
INTRAMUSCULAR | Status: DC | PRN
  Administered 2020-09-05: 30 mg via INTRAVENOUS

## 2020-09-05 MED ORDER — FENTANYL CITRATE (PF) 250 MCG/5ML IJ SOLN
INTRAMUSCULAR | Status: AC
  Filled 2020-09-05: qty 5

## 2020-09-05 MED ORDER — ALUM & MAG HYDROXIDE-SIMETH 200-200-20 MG/5ML PO SUSP: 30.0000 mL | ORAL | Status: DC | PRN

## 2020-09-05 MED ORDER — LIDOCAINE 2% (20 MG/ML) 5 ML SYRINGE
INTRAMUSCULAR | Status: DC | PRN
  Administered 2020-09-05: 1.5 mg/kg/h via INTRAVENOUS

## 2020-09-05 MED ORDER — ONDANSETRON HCL 4 MG PO TABS: 4.0000 mg | ORAL_TABLET | Freq: Four times a day (QID) | ORAL | Status: DC | PRN

## 2020-09-05 MED ORDER — DEXAMETHASONE SODIUM PHOSPHATE 10 MG/ML IJ SOLN
INTRAMUSCULAR | Status: DC | PRN
  Administered 2020-09-05: 10 mg via INTRAVENOUS

## 2020-09-05 MED ORDER — HYDROMORPHONE HCL 1 MG/ML IJ SOLN: 1.0000 mg | INTRAMUSCULAR | Status: DC | PRN

## 2020-09-05 MED ORDER — MIDAZOLAM HCL 2 MG/2ML IJ SOLN
INTRAMUSCULAR | Status: DC | PRN
  Administered 2020-09-05: 2 mg via INTRAVENOUS

## 2020-09-05 MED ORDER — OXYCODONE HCL 5 MG PO TABS
5.0000 mg | ORAL_TABLET | Freq: Once | ORAL | Status: DC | PRN
Start: 2020-09-05 — End: 2020-09-05

## 2020-09-05 MED ORDER — PROPOFOL 10 MG/ML IV BOLUS
INTRAVENOUS | Status: AC
  Filled 2020-09-05: qty 20

## 2020-09-05 MED ORDER — KETAMINE HCL 50 MG/5ML IJ SOSY
PREFILLED_SYRINGE | INTRAMUSCULAR | Status: AC
  Filled 2020-09-05: qty 5

## 2020-09-05 MED ORDER — FENTANYL CITRATE (PF) 100 MCG/2ML IJ SOLN
INTRAMUSCULAR | Status: AC
  Filled 2020-09-05: qty 2

## 2020-09-05 MED ORDER — FENTANYL CITRATE (PF) 100 MCG/2ML IJ SOLN
25.0000 ug | INTRAMUSCULAR | Status: DC | PRN
  Administered 2020-09-05 (×2): 25 ug via INTRAVENOUS
  Administered 2020-09-05: 50 ug via INTRAVENOUS

## 2020-09-05 MED ORDER — SIMETHICONE 80 MG PO CHEW: 80.0000 mg | CHEWABLE_TABLET | Freq: Four times a day (QID) | ORAL | Status: DC | PRN

## 2020-09-05 MED ORDER — OXYCODONE HCL 5 MG/5ML PO SOLN
5.0000 mg | Freq: Once | ORAL | Status: DC | PRN
Start: 2020-09-05 — End: 2020-09-05

## 2020-09-05 MED ORDER — ACETAMINOPHEN 500 MG PO TABS
1000.0000 mg | ORAL_TABLET | Freq: Four times a day (QID) | ORAL | Status: DC
  Administered 2020-09-05: 1000 mg via ORAL

## 2020-09-05 MED ORDER — OXYTOCIN 10 UNIT/ML IJ SOLN
INTRAMUSCULAR | Status: AC
  Filled 2020-09-05: qty 2

## 2020-09-05 MED ORDER — ONDANSETRON HCL 4 MG/2ML IJ SOLN: 4.0000 mg | Freq: Four times a day (QID) | INTRAMUSCULAR | Status: DC | PRN

## 2020-09-05 MED ORDER — LACTATED RINGERS IV SOLN: INTRAVENOUS | Status: DC

## 2020-09-05 MED ORDER — CEFAZOLIN IN SODIUM CHLORIDE 3-0.9 GM/100ML-% IV SOLN
INTRAVENOUS | Status: AC
  Filled 2020-09-05: qty 100

## 2020-09-05 MED ORDER — MIDAZOLAM HCL 2 MG/2ML IJ SOLN
INTRAMUSCULAR | Status: AC
  Filled 2020-09-05: qty 2

## 2020-09-05 MED ORDER — BUPIVACAINE HCL (PF) 0.25 % IJ SOLN
INTRAMUSCULAR | Status: DC | PRN
  Administered 2020-09-05: 16 mL

## 2020-09-05 MED ORDER — DEXTROSE-NACL 5-0.45 % IV SOLN: INTRAVENOUS | Status: DC

## 2020-09-05 SURGICAL SUPPLY — 56 items
ADH SKN CLS APL DERMABOND .7 (GAUZE/BANDAGES/DRESSINGS) ×4
BARRIER ADHS 3X4 INTERCEED (GAUZE/BANDAGES/DRESSINGS) IMPLANT
BRR ADH 4X3 ABS CNTRL BYND (GAUZE/BANDAGES/DRESSINGS)
CABLE HIGH FREQUENCY MONO STRZ (ELECTRODE) IMPLANT
COVER BACK TABLE 60X90IN (DRAPES) ×3 IMPLANT
COVER MAYO STAND STRL (DRAPES) ×3 IMPLANT
COVER WAND RF STERILE (DRAPES) ×3 IMPLANT
DERMABOND ADVANCED (GAUZE/BANDAGES/DRESSINGS) ×2
DERMABOND ADVANCED .7 DNX12 (GAUZE/BANDAGES/DRESSINGS) ×4 IMPLANT
DEVICE SUTURE ENDOST 10MM (ENDOMECHANICALS) ×3 IMPLANT
DRSG OPSITE POSTOP 4X10 (GAUZE/BANDAGES/DRESSINGS) IMPLANT
DURAPREP 26ML APPLICATOR (WOUND CARE) ×6 IMPLANT
GLOVE SRG 8 PF TXTR STRL LF DI (GLOVE) ×2 IMPLANT
GLOVE SURG ORTHO LTX SZ7.5 (GLOVE) ×6 IMPLANT
GLOVE SURG UNDER POLY LF SZ8 (GLOVE) ×3
GOWN STRL REUS W/TWL LRG LVL3 (GOWN DISPOSABLE) ×3 IMPLANT
GOWN STRL REUS W/TWL XL LVL3 (GOWN DISPOSABLE) ×6 IMPLANT
IV NS 1000ML (IV SOLUTION) ×3
IV NS 1000ML BAXH (IV SOLUTION) ×2 IMPLANT
KIT TURNOVER CYSTO (KITS) ×3 IMPLANT
NEEDLE INSUFFLATION 120MM (ENDOMECHANICALS) ×3 IMPLANT
NEEDLE SPNL 22GX3.5 QUINCKE BK (NEEDLE) ×3 IMPLANT
NS IRRIG 1000ML POUR BTL (IV SOLUTION) IMPLANT
NS IRRIG 500ML POUR BTL (IV SOLUTION) ×3 IMPLANT
OCCLUDER COLPOPNEUMO (BALLOONS) ×3 IMPLANT
PACK LAPAROSCOPY BASIN (CUSTOM PROCEDURE TRAY) ×3 IMPLANT
PACK TRENDGUARD 450 HYBRID PRO (MISCELLANEOUS) IMPLANT
PENCIL SMOKE EVACUATOR (MISCELLANEOUS) ×3 IMPLANT
PROTECTOR NERVE ULNAR (MISCELLANEOUS) IMPLANT
SCISSORS LAP 5X35 DISP (ENDOMECHANICALS) IMPLANT
SET IRRIG Y TYPE TUR BLADDER L (SET/KITS/TRAYS/PACK) ×3 IMPLANT
SET SUCTION IRRIG HYDROSURG (IRRIGATION / IRRIGATOR) ×3 IMPLANT
SET TRI-LUMEN FLTR TB AIRSEAL (TUBING) ×3 IMPLANT
SHEARS 1100 HARMONIC 36 (ELECTROSURGICAL) ×3 IMPLANT
SHEARS HARMONIC ACE PLUS 36CM (ENDOMECHANICALS) ×3 IMPLANT
SUT ENDO VLOC 180-0-8IN (SUTURE) IMPLANT
SUT VIC AB 0 CT1 36 (SUTURE) IMPLANT
SUT VIC AB 4-0 PS2 27 (SUTURE) ×3 IMPLANT
SUT VICRYL 0 UR6 27IN ABS (SUTURE) ×3 IMPLANT
SYR 10ML LL (SYRINGE) ×3 IMPLANT
SYR 20ML LL LF (SYRINGE) ×3 IMPLANT
SYR 50ML LL SCALE MARK (SYRINGE) ×3 IMPLANT
SYR CONTROL 10ML LL (SYRINGE) ×3 IMPLANT
TIP UTERINE 5.1X6CM LAV DISP (MISCELLANEOUS) IMPLANT
TIP UTERINE 6.7X10CM GRN DISP (MISCELLANEOUS) IMPLANT
TIP UTERINE 6.7X6CM WHT DISP (MISCELLANEOUS) ×3 IMPLANT
TIP UTERINE 6.7X8CM BLUE DISP (MISCELLANEOUS) IMPLANT
TOWEL OR 17X26 10 PK STRL BLUE (TOWEL DISPOSABLE) ×6 IMPLANT
TRAY FOLEY W/BAG SLVR 14FR LF (SET/KITS/TRAYS/PACK) ×3 IMPLANT
TRENDGUARD 450 HYBRID PRO PACK (MISCELLANEOUS)
TROCAR BLADELESS OPT 5 100 (ENDOMECHANICALS) ×3 IMPLANT
TROCAR PORT AIRSEAL 5X120 (TROCAR) ×3 IMPLANT
TROCAR XCEL DIL TIP R 11M (ENDOMECHANICALS) ×3 IMPLANT
TUBE CONNECTING 12X1/4 (SUCTIONS) ×3 IMPLANT
WARMER LAPAROSCOPE (MISCELLANEOUS) IMPLANT
YANKAUER SUCT BULB TIP NO VENT (SUCTIONS) ×6 IMPLANT

## 2020-09-05 NOTE — Anesthesia Procedure Notes (Addendum)
Procedure Name: Intubation Date/Time: 09/05/2020 10:31 AM Performed by: Mechele Claude, CRNA Pre-anesthesia Checklist: Patient identified, Emergency Drugs available, Suction available and Patient being monitored Patient Re-evaluated:Patient Re-evaluated prior to induction Oxygen Delivery Method: Circle system utilized Preoxygenation: Pre-oxygenation with 100% oxygen Induction Type: IV induction Ventilation: Mask ventilation without difficulty and Oral airway inserted - appropriate to patient size Laryngoscope Size: Glidescope Grade View: Grade I Tube type: Oral Tube size: 7.0 mm Number of attempts: 2 Airway Equipment and Method: Stylet and Oral airway Placement Confirmation: ETT inserted through vocal cords under direct vision,  positive ETCO2 and breath sounds checked- equal and bilateral Secured at: 21 cm Tube secured with: Tape Dental Injury: Teeth and Oropharynx as per pre-operative assessment and Bloody posterior oropharynx  Difficulty Due To: Difficulty was anticipated, Difficult Airway- due to large tongue and Difficult Airway- due to anterior larynx Comments: Easy mask airway with oral airway in place. Attempted DL x1 with MAC4. *Unable to visualize cords or arytenoids. Repositioned head and proceeded to use glidescope.

## 2020-09-05 NOTE — Progress Notes (Signed)
Post-op check, TLH, abd wall nodule removal Doing well, pain ok, no n/v, tole PO, voiding ok, ambulating Afeb, VSS Abd- soft, incisions well approximated, some bloody drainage from incision where abd wall mass was removed  Will place honeycomb dressing over low midline and umbilical incisions, d/c home

## 2020-09-05 NOTE — Op Note (Signed)
Preoperative diagnosis: Symptomatic myomatous uterus, abdominal wall mass Postoperative diagnosis: Same  Procedure: Total laparoscopic hysterectomy with vaginal morcellation, bilateral salpingectomy, removal of abdominal wall mass, cystoscopy Surgeon: Cheri Fowler M.D.  Assistant: Eula Flax, MD Anesthesia: Gen. Endotracheal tube  Findings: She had an enlarged, irregular uterus, normal tubes and ovaries with prominent ovarian vessels.  2 cm abdominal wall mass, normal bladder and patent ureters via cystoscopy.  Small omental adhesion to the anterior abdominal wall Specimens: Uterus, tubes, and abd wall mass for routine pathology  Estimated blood loss: 786 cc  Complications: None  Procedure in detail:  The patient was taken to the operating room and placed in the dorsosupine position. General anesthesia was induced. Arms were tucked to her sides and legs were placed in mobile stirrups. Abdomen perineum and vagina were then prepped and draped in usual sterile fashion. A 51mm RUMI with the medium sized metal cup was then applied to the uterus and cervix for uterine manipulation and a Foley catheter was inserted. Infraumbilical skin was infiltrated with quarter percent Marcaine and a 1 cm vertical incision was made. A veress needle was inserted into the peritoneal cavity and placement confirmed by the water drop test an opening pressure of 3 mm of mercury. CO2 was insufflated to a pressure of 13 mm mercury and the veress needle was removed. A 5 mm trocar was then introduced with direct visualization with the laparoscope. A 5 mm airseal port was then also placed on the right side and a 10/11 trocar placed on the left side under direct visualization. Inspection revealed the above-mentioned findings. The omental adhesion was taken down with harmonic scalpel.  The distal right fallopian tube was grasped with a grasper from the left side. The Harmonic scalpel Ace was used to take down the right mesosalpinx,  followed by the round ligament, utero-ovarian pedicle and broad ligament. The anterior peritoneum was incised across the anterior portion of the uterus to help release the bladder. Uterine artery artery was skeletonized and taken down with the harmonic scalpel Ace with adequate division and adequate hemostasis. A similar procedure was then performed on the patient's left side taking down the mesosalpinx, then the round ligament, utero-ovarian pedicle, and broad ligament. Anterior peritoneum was incised across the anterior portion the uterus to meet the incision coming from the patient's right side. Uterine artery was skeletonized and taken down with the Harmonic Scalpel with adequate division and adequate hemostasis. I then began to detach the cervix from the vagina. The RUMI cup was pushed superiorly, the Harmonic scalpel was placed on the cup edge and a circumferential incision was made starting anteriorly into the vagina. This released the uterosacral ligaments as well as all attachments to the vagina. At this point all pedicles appeared to be hemostatic and there was no other pathology noted.  The uterus was too big to be pulled into the vagina.  I used a vaginal approach to morcellate the uterus until it was completely removed.  A moist surgical towel was placed in the vagina to help maintain pneumoperitoneum.  The pelvis was irrigated and found to be hemostatic.  The vaginal cuff was then closed with the Endostitch device using 0 V-lok suture with adequate closure and hemostasis.  The pelvis was again irrigated and found to be hemostatic with good closure of the vaginal cuff.  The lateral ports were removed under direct visualization and all gas was allowed to deflate from the abdomen. The umbilical trocar was then removed. A 0 Vicryl suture was  placed as deep as possible on the left side where the 10/11 port was placed.  Skin incisions were closed with interrupted subcuticular sutures of 4-0 Vicryl followed  by Dermabond.  Attention was turned to the abdominal wall nodule.  The nodule was 2 to 3 cm in diameter and was just superior to her previous low transverse scar.  I made a 3 cm incision horizontally right over the mass.  I was then able to grasp the mass with an Allis clamp and start elevating it.  I used Bovie to go completely around the mass and remove it.  On palpation there was no mass remaining.  The deep part of the incision was closed with a figure-of-eight suture of 0 Vicryl.  Skin was closed with an interrupted subcuticular suture of 4-0 Vicryl followed by Dermabond.  Cystoscopy was performed with a 70 degree cystoscope, bladder appeared normal and clear urine flowed easily from each ureteral orifice.  Her foley catheter was removed to perform the cystoscopy, it was NOT replaced.  The patient tolerated the procedure well. She was taken to the recovery in stable condition. Counts were correct x2, she received Anmcef 3 gm IV at the beginning of the procedure and had PAS hose on throughout the procedure.

## 2020-09-05 NOTE — Anesthesia Preprocedure Evaluation (Addendum)
Anesthesia Evaluation  Patient identified by MRN, date of birth, ID band Patient awake    Reviewed: Allergy & Precautions, NPO status , Patient's Chart, lab work & pertinent test results  Airway Mallampati: III  TM Distance: >3 FB Neck ROM: Full    Dental no notable dental hx.    Pulmonary neg pulmonary ROS,    Pulmonary exam normal breath sounds clear to auscultation       Cardiovascular hypertension, Pt. on medications Normal cardiovascular exam Rhythm:Regular Rate:Normal  Normal sinus rhythm No previous tracing Confirmed by Carlyle Dolly [phone]) on 08/28/2020 12:45:11 PM   Neuro/Psych  Headaches, PSYCHIATRIC DISORDERS Anxiety Depression    GI/Hepatic negative GI ROS, Neg liver ROS,   Endo/Other  diabetes, Type 2, Insulin Dependent, Oral Hypoglycemic AgentsMorbid obesity  Renal/GU negative Renal ROS  negative genitourinary   Musculoskeletal negative musculoskeletal ROS (+)   Abdominal (+) + obese,   Peds negative pediatric ROS (+)  Hematology negative hematology ROS (+)   Anesthesia Other Findings   Reproductive/Obstetrics negative OB ROS                           Anesthesia Physical Anesthesia Plan  ASA: III  Anesthesia Plan: General   Post-op Pain Management:    Induction: Intravenous  PONV Risk Score and Plan: Scopolamine patch - Pre-op, Treatment may vary due to age or medical condition, Midazolam, Dexamethasone and Ondansetron  Airway Management Planned: Oral ETT  Additional Equipment:   Intra-op Plan:   Post-operative Plan: Extubation in OR  Informed Consent: I have reviewed the patients History and Physical, chart, labs and discussed the procedure including the risks, benefits and alternatives for the proposed anesthesia with the patient or authorized representative who has indicated his/her understanding and acceptance.     Dental advisory given  Plan  Discussed with: CRNA, Anesthesiologist and Surgeon  Anesthesia Plan Comments:         Anesthesia Quick Evaluation

## 2020-09-05 NOTE — Transfer of Care (Signed)
Immediate Anesthesia Transfer of Care Note  Patient: Debra Le  Procedure(s) Performed: Procedure(s) (LRB): TOTAL LAPAROSCOPIC HYSTERECTOMY WITH SALPINGECTOMY; REMOVAL ABDOMINAL WALL NODULE (Bilateral) CYSTOSCOPY (N/A)  Patient Location: PACU  Anesthesia Type: General  Level of Consciousness: awake, alert  and oriented  Airway & Oxygen Therapy: Patient Spontanous Breathing and Patient connected to face mask oxygen  Post-op Assessment: Report given to PACU RN and Post -op Vital signs reviewed and stable  Post vital signs: Reviewed and stable  Complications: No apparent anesthesia complications  Last Vitals:  Vitals Value Taken Time  BP 141/82 09/05/20 1331  Temp    Pulse 80 09/05/20 1337  Resp 12 09/05/20 1337  SpO2 99 % 09/05/20 1337  Vitals shown include unvalidated device data.  Last Pain:  Vitals:   09/05/20 0841  TempSrc: Oral  PainSc: 4       Patients Stated Pain Goal: 5 (62/69/48 [id number])  Complications: No complications documented.

## 2020-09-05 NOTE — H&P (Signed)
Debra Le is an 45 y.o. female. She has known myomatous uterus.  She had endometrial ablation in 2020 to help with menses, but has continued to have irregular menses that can be heavy and painful.  She has been on OCP and POP, continues to have irregular bleeding, now desires definitive surgical treatment.  Pertinent Gynecological History: Last mammogram: normal Date: 09/2019 Last pap: normal Date: 2020 OB History: G2, P1102 SVD, LTCS   Menstrual History: Patient's last menstrual period was 08/05/2020.    Past Medical History:  Diagnosis Date  . Abnormal uterine bleeding (AUB)   . Anxiety   . COVID 04/2019   cough x 7 days all symptoms reoslved  . Depression   . DM type 2 (diabetes mellitus, type 2) ([place])   . GERD (gastroesophageal reflux disease)   . Heart murmur    mild no cardiologist  . Hypertension   . Obesity   . Palpitation    occ no cardiologist  . Wears glasses    for reading    Past Surgical History:  Procedure Laterality Date  . CESAREAN SECTION  02/21/2005   with Tubal ligation    Family History  Problem Relation Age of Onset  . Diabetes Mother   . Diabetes Maternal Aunt        all maternal side  . Diabetes Sister        x 2  . Hypertension Maternal Aunt        all maternal side  . Lymphoma Father   . Heart attack Maternal Aunt   . Dementia Maternal Aunt     Social History:  reports that she has never smoked. She has never used smokeless tobacco. She reports current alcohol use of about 7.0 standard drinks of alcohol per week. She reports that she does not use drugs.  Allergies: No Known Allergies  No medications prior to admission.    Review of Systems  Respiratory: Negative.   Cardiovascular: Negative.     Height 5\' 10"  (1.778 m), weight (!) 143.3 kg, last menstrual period 08/05/2020. Physical Exam Constitutional:      Appearance: Normal appearance.  Cardiovascular:     Rate and Rhythm: Normal rate and regular rhythm.     Heart  sounds: Normal heart sounds. No murmur heard.   Pulmonary:     Effort: Pulmonary effort is normal. No respiratory distress.     Breath sounds: Normal breath sounds.  Abdominal:     General: There is no distension.     Palpations: Abdomen is soft. There is mass (has a 2-3 cm nodule in her transverse scar).     Tenderness: There is no abdominal tenderness.  Genitourinary:    General: Normal vulva.     Comments: Uterus enlarged to 10-12 weeks, irregular, no adnexal mass Musculoskeletal:     Cervical back: Normal range of motion and neck supple.  Neurological:     Mental Status: She is alert.     Results for orders placed or performed during the hospital encounter of 09/04/20 (from the past 24 hour(s))  SARS CORONAVIRUS 2 (TAT 6-24 HRS) Nasopharyngeal Nasopharyngeal Swab     Status: None   Collection Time: 09/04/20  8:30 AM   Specimen: Nasopharyngeal Swab  Result Value Ref Range   SARS Coronavirus 2 NEGATIVE NEGATIVE    No results found.  Assessment/Plan: Symptomatic myomatous uterus, abdominal wall nodule.  All medical and surgical options have been discussed.  Surgical procedure, risks, alternatives, chances of relieving her symptoms have  all been discussed, questions answered.  Will admit for TLH, bilateral salpingectomy, cystoscopy, removal of abdominal wall nodule.  I will probably have to morcellate the uterus vaginally to remove it.  She is aware of the possibility of needing abdominal hysterectomy  Blane Ohara Riven Mabile 09/05/2020, 7:47 AM

## 2020-09-05 NOTE — Brief Op Note (Signed)
09/05/2020  1:15 PM  PATIENT:  Debra Le  45 y.o. female  PRE-OPERATIVE DIAGNOSIS:  abnormal uterine bleeding, leiomyoma of uterus  POST-OPERATIVE DIAGNOSIS:  abnormal uterine bleeding, leiomyoma of uterus  PROCEDURE:  Procedure(s) with comments: TOTAL LAPAROSCOPIC HYSTERECTOMY WITH SALPINGECTOMY; REMOVAL ABDOMINAL WALL NODULE (Bilateral) CYSTOSCOPY (N/A) - possible  SURGEON:  Surgeon(s) and Role:    * Chakara Bognar, MD - Primary    * Shivaji, Melida Quitter, MD - Assisting   ANESTHESIA:   general  EBL:  200 mL   BLOOD ADMINISTERED:none  DRAINS: none   LOCAL MEDICATIONS USED:  MARCAINE     SPECIMEN:  Source of Specimen:  Uterus, tubes, abdominal wall mass  DISPOSITION OF SPECIMEN:  PATHOLOGY  COUNTS:  YES  TOURNIQUET:  * No tourniquets in log *  DICTATION: .Dragon Dictation  PLAN OF CARE: observation  PATIENT DISPOSITION:  PACU - hemodynamically stable.   Delay start of Pharmacological VTE agent (>24hrs) due to surgical blood loss or risk of bleeding: yes

## 2020-09-05 NOTE — Discharge Instructions (Signed)
Routine instructions for laparoscopic hysterectomy   Laparoscopically Assisted Vaginal Hysterectomy, Care After The following information offers guidance on how to care for yourself after your procedure. Your health care provider may also give you more specific instructions. If you have problems or questions, contact your health care provider. What can I expect after the procedure? After the procedure, it is common to have:  Soreness and numbness in your incision areas.  Abdominal pain. You will be given pain medicine to control it.  Vaginal bleeding and discharge. You will need to use a sanitary pad after this procedure.  Tiredness (fatigue).  Poor appetite.  Less interest in sex.  Feelings of sadness or other emotions. If your ovaries were also removed, it is common to have symptoms of menopause, such as hot flashes, night sweats, and lack of sleep (insomnia). Follow these instructions at home: Medicines  Take over-the-counter and prescription medicines only as told by your health care provider.  Do not take aspirin or NSAIDs, such as ibuprofen. These medicines can cause bleeding.  Ask your health care provider if the medicine prescribed to you: ? Requires you to avoid driving or using heavy machinery. ? Can cause constipation. You may need to take these actions to prevent or treat constipation:  Drink enough fluid to keep your urine pale yellow.  Take over-the-counter or prescription medicines.  Eat foods that are high in fiber, such as beans, whole grains, and fresh fruits and vegetables.  Limit foods that are high in fat and processed sugars, such as fried or sweet foods. Incision care  Follow instructions from your health care provider about how to take care of your incisions. Make sure you: ? Wash your hands with soap and water for at least 20 seconds before and after you change your bandage (dressing). If soap and water are not available, use hand  sanitizer. ? Change your dressing as told by your health care provider. ? Leave stitches (sutures), skin glue, or adhesive strips in place. These skin closures may need to stay in place for 2 weeks or longer. If adhesive strip edges start to loosen and curl up, you may trim the loose edges. Do not remove adhesive strips completely unless your health care provider tells you to do that.  Check your incision areas every day for signs of infection. Check for: ? More redness, swelling, or pain. ? Fluid or blood. ? Warmth. ? Pus or a bad smell.   Activity  Rest as told by your healthcare provider.  Return to your normal activities as told by your health care provider. Ask your health care provider what activities are safe for you.  Avoid sitting for a long time without moving. Get up to take short walks every 1-2 hours. This is important to improve blood flow and breathing. Ask for help if you feel weak or unsteady.  Do not lift anything that is heavier than 10 lb (4.5 kg), or the limit that you are told, until your health care provider says that it is safe.  If you were given a sedative during the procedure, it can affect you for several hours. Do not drive or operate machinery until your health care provider says that it is safe.   Lifestyle  Do not use any products that contain nicotine or tobacco. These products include cigarettes, chewing tobacco, and vaping devices, such as e-cigarettes. These can delay healing after surgery. If you need help quitting, ask your health care provider.  Do not drink alcohol  until your health care provider approves. General instructions  Do not douche, use tampons, or have sex for at least 6 weeks, or as told by your health care provider.  If you struggle with physical or emotional changes after your procedure, speak with your health care provider or a therapist.  Do not take baths, swim, or use a hot tub until your health care provider approves. You may  only be allowed to take showers for 2-3 weeks.  Keep your dressing dry until your health care provider says it can be removed.  Try to have someone at home with you for the first 1-2 weeks to help with your daily chores.  Wear compression stockings as told by your health care provider. These stockings help to prevent blood clots and reduce swelling in your legs.  Keep all follow-up visits. This is important.   Contact a health care provider if:  You have any of these signs of infection: ? More redness, swelling, warmth, or pain around an incision. ? Fluid or blood coming from an incision. ? Pus or a bad smell coming from an incision. ? Chills or a fever.  An incision opens.  Your pain medicine is not helping.  You feel dizzy or light-headed.  You have pain or bleeding when you urinate.  You have nausea and vomiting that does not go away.  You have pus, or a bad-smelling discharge coming from your vagina. Get help right away if:  You have a fever and your symptoms suddenly get worse.  You have severe abdominal pain.  You have chest pain.  You have shortness of breath.  You faint.  You have pain, swelling, or redness in your leg.  You have heavy vaginal bleeding and blood clots, soaking through a sanitary pad in less than 1 hour. These symptoms may represent a serious problem that is an emergency. Do not wait to see if the symptoms will go away. Get medical help right away. Call your local emergency services (911 in the [place] Do not drive yourself to the hospital. Summary  After the procedure, it is common to have abdominal pain and vaginal bleeding.  Wear a sanitary pad for vaginal discharge or bleeding.  You should not drive or lift heavy objects until your health care provider says that it is safe.  Contact your health care provider if you have any symptoms of infection, heavy vaginal bleeding, nausea, vomiting, or shortness of breath. This information is not  intended to replace advice given to you by your health care provider. Make sure you discuss any questions you have with your health care provider. Document Revised: 11/25/2019 Document Reviewed: 11/25/2019 Elsevier Patient Education  2021 [employer] Instructions  Activity: Get plenty of rest for the remainder of the day. A responsible individual must stay with you for 24 hours following the procedure.  For the next 24 hours, DO NOT: -Drive a car -Paediatric nurse -Drink alcoholic beverages -Take any medication unless instructed by your physician -Make any legal decisions or sign important papers.  Meals: Start with liquid foods such as gelatin or soup. Progress to regular foods as tolerated. Avoid greasy, spicy, heavy foods. If nausea and/or vomiting occur, drink only clear liquids until the nausea and/or vomiting subsides. Call your physician if vomiting continues.  Special Instructions/Symptoms: Your throat may feel dry or sore from the anesthesia or the breathing tube placed in your throat during surgery. If this causes discomfort, gargle with warm  salt water. The discomfort should disappear within 24 hours.  If you had a scopolamine patch placed behind your ear for the management of post- operative nausea and/or vomiting:  1. The medication in the patch is effective for 72 hours, after which it should be removed.  Wrap patch in a tissue and discard in the trash. Wash hands thoroughly with soap and water. 2. You may remove the patch earlier than 72 hours if you experience unpleasant side effects which may include dry mouth, dizziness or visual disturbances. 3. Avoid touching the patch. Wash your hands with soap and water after contact with the patch.

## 2020-09-05 NOTE — Interval H&P Note (Signed)
History and Physical Interval Note:  09/05/2020 10:02 AM  Debra Le  has presented today for surgery, with the diagnosis of abnormal uterine bleeding, leiomyoma of uterus.  The various methods of treatment have been discussed with the patient and family. After consideration of risks, benefits and other options for treatment, the patient has consented to  Procedure(s) with comments: TOTAL LAPAROSCOPIC HYSTERECTOMY WITH SALPINGECTOMY; REMOVAL ABDOMINAL WALL NODULE (Bilateral) POSSIBLE CYSTOSCOPY (N/A) - possible as a surgical intervention.  The patient's history has been reviewed, patient examined, no change in status, stable for surgery.  I have reviewed the patient's chart and labs.  Questions were answered to the patient's satisfaction.     Blane Ohara Kirandeep Fariss

## 2020-09-06 ENCOUNTER — Encounter (HOSPITAL_BASED_OUTPATIENT_CLINIC_OR_DEPARTMENT_OTHER): Payer: Self-pay | Admitting: Obstetrics and Gynecology

## 2020-09-06 LAB — SURGICAL PATHOLOGY

## 2020-09-06 NOTE — Anesthesia Postprocedure Evaluation (Signed)
Anesthesia Post Note  Patient: Debra Le  Procedure(s) Performed: TOTAL LAPAROSCOPIC HYSTERECTOMY WITH SALPINGECTOMY; REMOVAL ABDOMINAL WALL NODULE (Bilateral Abdomen) CYSTOSCOPY (N/A Bladder)     Patient location during evaluation: PACU Anesthesia Type: General Level of consciousness: awake and alert Pain management: pain level controlled Vital Signs Assessment: post-procedure vital signs reviewed and stable Respiratory status: spontaneous breathing, nonlabored ventilation, respiratory function stable and patient connected to nasal cannula oxygen Cardiovascular status: blood pressure returned to baseline and stable Postop Assessment: no apparent nausea or vomiting Anesthetic complications: no   No complications documented.  Last Vitals:  Vitals:   09/05/20 1500 09/05/20 1700  BP: 123/81 (!) 141/65  Pulse: 79 81  Resp: 18 (!) 24  Temp: 36.8 C 36.4 C  SpO2: 98% 98%    Last Pain:  Vitals:   09/05/20 1700  TempSrc:   PainSc: 3                  Pricsilla Lindvall L Noorah Giammona

## 2020-10-11 ENCOUNTER — Other Ambulatory Visit: Payer: Self-pay | Admitting: Internal Medicine

## 2020-11-29 NOTE — Patient Instructions (Addendum)
    Blood work was ordered.      Medications changes include :   none     Please followup in 6 months  

## 2020-11-29 NOTE — Progress Notes (Signed)
Subjective:    Patient ID: Debra Le, female    DOB: 11-13-1975, 45 y.o.   MRN: [id number]  HPI The patient is here for follow up of their chronic medical problems, including DM, htn, hichol, GERD, depression, anxiety, obesity  She has no energy.  She is not currently exercising.    Medications and allergies reviewed with patient and updated if appropriate.  Patient Active Problem List   Diagnosis Date Noted   Uterine leiomyoma 09/05/2020   History of long-term treatment with high-risk medication 05/31/2020   Shingles 04/16/2020   Iron deficiency 04/23/2018   Verruca pedis 02/07/2018   Hyperpigmented skin lesion 01/10/2018   Skin abnormality 12/25/2017   Other hyperlipidemia 03/23/2017   Endometriosis 06/13/2016   Depression 06/13/2016   Diabetes ([place]) 09/20/2015   Keloid 09/19/2015   Lump of skin 09/19/2015   Migraines 09/19/2015   Essential hypertension, benign 03/09/2015   GERD (gastroesophageal reflux disease) 03/09/2015   Anxiety 03/09/2015   Vitamin D deficiency 03/09/2015   Morbid obesity ([place]) 03/09/2015    Current Outpatient Medications on File Prior to Visit  Medication Sig Dispense Refill   blood glucose meter kit and supplies KIT Dispense based on patient and insurance preference. Use up to four times daily as directed. 1 each 0   buPROPion (WELLBUTRIN SR) 100 MG 12 hr tablet Take 1 tablet (100 mg total) by mouth 2 (two) times daily. (Patient taking differently: Take 100 mg by mouth daily.) 180 tablet 1   escitalopram (LEXAPRO) 10 MG tablet TAKE 1 AND 1/2 TABLETS(15 MG) BY MOUTH DAILY 135 tablet 1   esomeprazole (NEXIUM) 40 MG capsule Take 1 capsule (40 mg total) by mouth daily. 90 capsule 1   metFORMIN (GLUCOPHAGE) 500 MG tablet TAKE 1 TABLET(500 MG) BY MOUTH TWICE DAILY WITH A MEAL 180 tablet 1   metoprolol tartrate (LOPRESSOR) 50 MG tablet Take 1 tablet (50 mg total) by mouth 2 (two) times daily. 180 tablet 1   Semaglutide, 1 MG/DOSE, (OZEMPIC, 1  MG/DOSE,) 2 MG/1.5ML SOPN Inject 1 mg into the skin once a week. (Patient taking differently: Inject 1 mg into the skin once a week. tuesday) 18 mL 1   ibuprofen (ADVIL) 600 MG tablet Take 1 tablet (600 mg total) by mouth every 6 (six) hours. (Patient not taking: Reported on 11/30/2020) 30 tablet 0   No current facility-administered medications on file prior to visit.    Past Medical History:  Diagnosis Date   Abnormal uterine bleeding (AUB)    Anxiety    COVID 04/2019   cough x 7 days all symptoms reoslved   Depression    DM type 2 (diabetes mellitus, type 2) (HCC)    GERD (gastroesophageal reflux disease)    Heart murmur    mild no cardiologist   Hypertension    Obesity    Palpitation    occ no cardiologist   Wears glasses    for reading    Past Surgical History:  Procedure Laterality Date   CESAREAN SECTION  02/21/2005   with Tubal ligation   CYSTOSCOPY N/A 09/05/2020   Procedure: CYSTOSCOPY;  Surgeon: Cheri Fowler, MD;  Location: [facility];  Service: Gynecology;  Laterality: N/A;  possible   TOTAL LAPAROSCOPIC HYSTERECTOMY WITH SALPINGECTOMY Bilateral 09/05/2020   Procedure: TOTAL LAPAROSCOPIC HYSTERECTOMY WITH SALPINGECTOMY; REMOVAL ABDOMINAL WALL NODULE;  Surgeon: Cheri Fowler, MD;  Location: [facility];  Service: Gynecology;  Laterality: Bilateral;    Social History   Socioeconomic  History   Marital status: Married    Spouse name: Not on file   Number of children: 2   Years of education: Not on file   Highest education level: Not on file  Occupational History   Occupation: RN  Tobacco Use   Smoking status: Never   Smokeless tobacco: Never  Vaping Use   Vaping Use: Never used  Substance and Sexual Activity   Alcohol use: Yes    Alcohol/week: 7.0 standard drinks    Types: 7 Standard drinks or equivalent per week    Comment: occ wine   Drug use: No   Sexual activity: Not on file  Other Topics Concern   Not on file   Social History Narrative   Psychiatric NP with [employer]      Social Determinants of Health   Financial Resource Strain: Not on file  Food Insecurity: Not on file  Transportation Needs: Not on file  Physical Activity: Not on file  Stress: Not on file  Social Connections: Not on file    Family History  Problem Relation Age of Onset   Diabetes Mother    Diabetes Maternal Aunt        all maternal side   Diabetes Sister        x 2   Hypertension Maternal Aunt        all maternal side   Lymphoma Father    Heart attack Maternal Aunt    Dementia Maternal Aunt     Review of Systems  Constitutional:  Negative for fever.  Respiratory:  Negative for cough, shortness of breath and wheezing.   Cardiovascular:  Negative for chest pain, palpitations and leg swelling.  Neurological:  Negative for light-headedness and headaches.      Objective:   Vitals:   11/30/20 1504  BP: 130/72  Pulse: 83  Temp: 98.2 F (36.8 C)  SpO2: 99%   BP Readings from Last 3 Encounters:  11/30/20 130/72  09/05/20 (!) 141/65  08/27/20 133/75   Wt Readings from Last 3 Encounters:  11/30/20 (!) 319 lb (144.7 kg)  09/05/20 (!) 315 lb 14.4 oz (143.3 kg)  08/27/20 (!) 316 lb (143.3 kg)   Body mass index is 45.77 kg/m.  Depression screen [facility] 2/9 11/30/2020 11/28/2019 06/09/2019 10/29/2018 03/23/2017  Decreased Interest 0 0 0 0 0  Down, Depressed, Hopeless 0 0 0 0 0  PHQ - 2 Score 0 0 0 0 0  Altered sleeping 0 0 3 0 -  Tired, decreased energy 2 0 0 0 -  Change in appetite 0 0 0 0 -  Feeling bad or failure about yourself  0 0 0 0 -  Trouble concentrating 0 0 0 0 -  Moving slowly or fidgety/restless 0 0 0 0 -  Suicidal thoughts 0 0 0 0 -  PHQ-9 Score 2 0 3 0 -  Difficult doing work/chores Somewhat difficult Not difficult at all - - -    GAD 7 : Generalized Anxiety Score 11/30/2020 06/09/2019  Nervous, Anxious, on Edge 0 0  Control/stop worrying 0 0  Worry too much - different things 0 0  Trouble  relaxing 0 0  Restless 0 0  Easily annoyed or irritable 0 0  Afraid - awful might happen 0 0  Total GAD 7 Score 0 0       Physical Exam    Constitutional: Appears well-developed and well-nourished. No distress.  HENT:  Head: Normocephalic and atraumatic.  Neck: Neck supple. No tracheal  deviation present. No thyromegaly present.  No cervical lymphadenopathy Cardiovascular: Normal rate, regular rhythm and normal heart sounds.   No murmur heard. No carotid bruit .  No edema Pulmonary/Chest: Effort normal and breath sounds normal. No respiratory distress. No has no wheezes. No rales.  Skin: Skin is warm and dry. Not diaphoretic.  Psychiatric: Normal mood and affect. Behavior is normal.      Assessment & Plan:    See Problem List for Assessment and Plan of chronic medical problems.    This visit occurred during the SARS-CoV-2 public health emergency.  Safety protocols were in place, including screening questions prior to the visit, additional usage of staff PPE, and extensive cleaning of exam room while observing appropriate contact time as indicated for disinfecting solutions.

## 2020-11-30 ENCOUNTER — Ambulatory Visit: Payer: 59 | Admitting: Internal Medicine

## 2020-11-30 ENCOUNTER — Other Ambulatory Visit: Payer: Self-pay | Admitting: Internal Medicine

## 2020-11-30 ENCOUNTER — Other Ambulatory Visit: Payer: Self-pay

## 2020-11-30 ENCOUNTER — Encounter: Payer: Self-pay | Admitting: Internal Medicine

## 2020-11-30 VITALS — BP 130/72 | HR 83 | Temp 98.2°F | Ht 70.0 in | Wt 319.0 lb

## 2020-11-30 DIAGNOSIS — I10 Essential (primary) hypertension: Secondary | ICD-10-CM

## 2020-11-30 DIAGNOSIS — E1169 Type 2 diabetes mellitus with other specified complication: Secondary | ICD-10-CM | POA: Diagnosis not present

## 2020-11-30 DIAGNOSIS — E559 Vitamin D deficiency, unspecified: Secondary | ICD-10-CM

## 2020-11-30 DIAGNOSIS — F419 Anxiety disorder, unspecified: Secondary | ICD-10-CM

## 2020-11-30 DIAGNOSIS — F3289 Other specified depressive episodes: Secondary | ICD-10-CM | POA: Diagnosis not present

## 2020-11-30 DIAGNOSIS — R5383 Other fatigue: Secondary | ICD-10-CM | POA: Insufficient documentation

## 2020-11-30 DIAGNOSIS — Z1331 Encounter for screening for depression: Secondary | ICD-10-CM

## 2020-11-30 DIAGNOSIS — E7849 Other hyperlipidemia: Secondary | ICD-10-CM | POA: Diagnosis not present

## 2020-11-30 DIAGNOSIS — K219 Gastro-esophageal reflux disease without esophagitis: Secondary | ICD-10-CM

## 2020-11-30 LAB — COMPREHENSIVE METABOLIC PANEL
ALT: 19 U/L (ref 0–35)
AST: 19 U/L (ref 0–37)
Albumin: 3.8 g/dL (ref 3.5–5.2)
Alkaline Phosphatase: 145 U/L — ABNORMAL HIGH (ref 39–117)
BUN: 8 mg/dL (ref 6–23)
CO2: 27 mEq/L (ref 19–32)
Calcium: 9 mg/dL (ref 8.4–10.5)
Chloride: 105 mEq/L (ref 96–112)
Creatinine, Ser: 0.86 mg/dL (ref 0.40–1.20)
GFR: 82.04 mL/min (ref >60.00)
Glucose, Bld: 78 mg/dL (ref 70–99)
Potassium: 3.7 mEq/L (ref 3.5–5.1)
Sodium: 138 mEq/L (ref 135–145)
Total Bilirubin: 0.2 mg/dL (ref 0.2–1.2)
Total Protein: 7.8 g/dL (ref 6.0–8.3)

## 2020-11-30 LAB — LIPID PANEL
Cholesterol: 142 mg/dL (ref 0–200)
HDL: 35.2 mg/dL — ABNORMAL LOW (ref >39.00)
LDL Cholesterol: 95 mg/dL (ref 0–99)
NonHDL: 106.92
Total CHOL/HDL Ratio: 4
Triglycerides: 59 mg/dL (ref 0.0–149.0)
VLDL: 11.8 mg/dL (ref 0.0–40.0)

## 2020-11-30 LAB — MICROALBUMIN / CREATININE URINE RATIO
Creatinine,U: 202.7 mg/dL
Microalb Creat Ratio: 0.7 mg/g (ref 0.0–30.0)
Microalb, Ur: 1.5 mg/dL (ref 0.0–1.9)

## 2020-11-30 LAB — VITAMIN D 25 HYDROXY (VIT D DEFICIENCY, FRACTURES): VITD: 22.32 ng/mL — ABNORMAL LOW (ref 30.00–100.00)

## 2020-11-30 LAB — HEMOGLOBIN A1C: Hgb A1c MFr Bld: 6.4 % (ref 4.6–6.5)

## 2020-11-30 NOTE — Assessment & Plan Note (Signed)
Chronic Check lipid panel  Continue atorvastatin 20 mg  Regular exercise and healthy diet encouraged

## 2020-11-30 NOTE — Assessment & Plan Note (Addendum)
Chronic PHQ9 score 2 - confirms depression is controlled Controlled, stable Continue lexapro 15 mg qd, wellbutrin 100 mg bid

## 2020-11-30 NOTE — Assessment & Plan Note (Signed)
Chronic Taking vitamin D daily Check vitamin D level  

## 2020-11-30 NOTE — Assessment & Plan Note (Signed)
Chronic GERD controlled Continue nexium 40 mg qd  

## 2020-11-30 NOTE — Assessment & Plan Note (Addendum)
Chronic GAD7 score 0  - well controlled Controlled, stable Continue lexapro 15 mg qd, wellbutrin 100 mg bid

## 2020-11-30 NOTE — Assessment & Plan Note (Signed)
Chronic BP well controlled Continue amlodipine 10 mg qd, metoprolol 50 mg bid cmp

## 2020-11-30 NOTE — Assessment & Plan Note (Signed)
Chronic Taking a long nap daily and going to bed late Tired in the afternoon She snores, but denies witnessed apnea - OSA is possible - advised she consider getting tested Start regular exercise, work on weight loss Limit nap during the day to one hour Go to bed earlier - maintain good sleep cycle

## 2020-11-30 NOTE — Assessment & Plan Note (Signed)
Chronic Lab Results  Component Value Date   HGBA1C 6.4 11/30/2020   Controlled Continue metformin 500 mg bid and ozempic 1 mg weekly

## 2020-12-25 ENCOUNTER — Telehealth: Payer: Self-pay

## 2020-12-25 NOTE — Telephone Encounter (Signed)
-----   Message from Binnie Rail, MD sent at 12/02/2020  1:34 PM EDT ----- Urine has no protein.  Your cholesterol is good.  Your kidney function and liver tests are normal.   Your sugars are controlled.  Your vitamin d level is low - increase your vitamin d intake by 1000 units daily.

## 2020-12-25 NOTE — Telephone Encounter (Signed)
Informed patient of results and recommendations. 

## 2021-01-13 ENCOUNTER — Other Ambulatory Visit: Payer: Self-pay | Admitting: Internal Medicine

## 2021-01-15 LAB — HM MAMMOGRAPHY

## 2021-01-16 ENCOUNTER — Telehealth: Payer: Self-pay | Admitting: Internal Medicine

## 2021-01-16 MED ORDER — SAXENDA 18 MG/3ML ~~LOC~~ SOPN: PEN_INJECTOR | SUBCUTANEOUS | 5 refills | Status: DC

## 2021-01-16 NOTE — Telephone Encounter (Signed)
Yes we can try it.  She has to start at the low dose and increase to the max dose.  Rx sent to pharmacy.

## 2021-01-16 NOTE — Telephone Encounter (Signed)
Patient requesting to change rx Semaglutide, 1 MG/DOSE, (OZEMPIC, 1 MG/DOSE,) 2 MG/1.5ML SOPN  to Saxenda  Please call patient

## 2021-01-17 NOTE — Telephone Encounter (Signed)
Debra Le (Key: [id number])

## 2021-01-17 NOTE — Telephone Encounter (Signed)
Spoke with patient today. 

## 2021-01-17 NOTE — Telephone Encounter (Signed)
Debra Le will need a prior auth  Please send everything to  [facility], [place] - [address] AT [address] Phone:  [phone]  Fax:  [phone]

## 2021-01-22 ENCOUNTER — Telehealth: Payer: Self-pay

## 2021-01-24 NOTE — Telephone Encounter (Signed)
Spoke with Debra Le today from [facility].  Phone number given to call for appeal@ [phone]  Ref # [id number]

## 2021-01-25 NOTE — Telephone Encounter (Signed)
Phone number for appeals was [phone]

## 2021-01-28 ENCOUNTER — Other Ambulatory Visit: Payer: Self-pay | Admitting: Obstetrics and Gynecology

## 2021-01-28 DIAGNOSIS — R928 Other abnormal and inconclusive findings on diagnostic imaging of breast: Secondary | ICD-10-CM

## 2021-01-30 ENCOUNTER — Encounter: Payer: Self-pay | Admitting: Internal Medicine

## 2021-01-30 NOTE — Progress Notes (Signed)
Outside notes received. Information abstracted. Notes sent to scan.  

## 2021-02-07 NOTE — Telephone Encounter (Signed)
Call her - lets try the new drug mounjaro - it is a diabetes medication and is helping many people with weight loss -- if it is not covered she can get a savings card off the Internet and that will reduce the price to $25  or something reasonable.  This will definitely help with her sugars and weight.  Let me know - I can send an rx in -- she would start at a low dose x 1 month and increase it each month

## 2021-02-07 NOTE — Addendum Note (Signed)
Addended by: Binnie Rail on: 02/07/2021 05:16 PM   Modules accepted: Orders

## 2021-02-08 ENCOUNTER — Other Ambulatory Visit: Payer: Self-pay

## 2021-02-08 ENCOUNTER — Ambulatory Visit: Payer: 59

## 2021-02-08 ENCOUNTER — Ambulatory Visit
Admission: RE | Admit: 2021-02-08 | Discharge: 2021-02-08 | Disposition: A | Discharge status: Home / Self Care | Payer: 59 | Source: Ambulatory Visit | Attending: Obstetrics and Gynecology | Admitting: Obstetrics and Gynecology

## 2021-02-08 DIAGNOSIS — R928 Other abnormal and inconclusive findings on diagnostic imaging of breast: Secondary | ICD-10-CM

## 2021-02-08 MED ORDER — TIRZEPATIDE 2.5 MG/0.5ML ~~LOC~~ SOAJ: 2.5000 mg | SUBCUTANEOUS | 0 refills | Status: DC

## 2021-02-08 NOTE — Addendum Note (Signed)
Addended by: Marcina Millard on: 02/08/2021 04:16 PM   Modules accepted: Orders

## 2021-02-08 NOTE — Telephone Encounter (Signed)
Sent in today 

## 2021-02-26 ENCOUNTER — Telehealth: Payer: Self-pay

## 2021-02-26 ENCOUNTER — Telehealth: Payer: Self-pay | Admitting: Internal Medicine

## 2021-02-26 MED ORDER — TIRZEPATIDE 2.5 MG/0.5ML ~~LOC~~ SOAJ: 2.5000 mg | SUBCUTANEOUS | 0 refills | Status: DC

## 2021-02-26 NOTE — Telephone Encounter (Signed)
Sent in today 

## 2021-02-26 NOTE — Telephone Encounter (Signed)
1.Medication Requested: tirzepatide [facility]) 2.5 MG/0.5ML Pen 2. Pharmacy (Name, [facility]): [facility], [address] AT [address] Phone:  [phone]  Fax:  [phone]     3. On Med List: Y  4. Last Visit with PCP: 8.26.22  5. Next visit date with PCP: 2.27.23  *per pt the last visit they discussed upping the dosage progressively. She was unsure if she needed to increase dosage on this refill or next refill.*   Agent: Please be advised that RX refills may take up to 3 business days. We ask that you follow-up with your pharmacy.

## 2021-03-18 MED ORDER — TIRZEPATIDE 5 MG/0.5ML ~~LOC~~ SOAJ: 5.0000 mg | SUBCUTANEOUS | 0 refills | Status: DC

## 2021-03-18 NOTE — Telephone Encounter (Signed)
Patient calling in  Patient says she needs a refill on the tirzepatide [facility]) 2.5 MG/0.5ML Pen but the dosage is suppose to increase to 5mg   Pharmacy:  [facility] [place], [address] AT [address]  Phone:  [phone] Fax:  [phone]

## 2021-03-18 NOTE — Addendum Note (Signed)
Addended by: Binnie Rail on: 03/18/2021 04:32 PM   Modules accepted: Orders

## 2021-04-03 ENCOUNTER — Other Ambulatory Visit: Payer: Self-pay | Admitting: Internal Medicine

## 2021-04-13 ENCOUNTER — Other Ambulatory Visit: Payer: Self-pay | Admitting: Internal Medicine

## 2021-04-14 ENCOUNTER — Other Ambulatory Visit: Payer: Self-pay | Admitting: Internal Medicine

## 2021-04-16 ENCOUNTER — Telehealth: Payer: Self-pay | Admitting: Internal Medicine

## 2021-04-22 MED ORDER — TIRZEPATIDE 7.5 MG/0.5ML ~~LOC~~ SOAJ: 7.5000 mg | SUBCUTANEOUS | 0 refills | Status: DC

## 2021-04-22 NOTE — Telephone Encounter (Signed)
The Ozempic is not a substitute for mounjaro so I would no recommend switching to that temporarily-its completely different medication.    The next dose up from 5 mg to 7.5 mg.  We can go up if she is feeling good on the 5 mg.  I will send the 7.5 mg dose to her pharmacy.  Before increasing the dose further I would like to see her in the office  7.5 mg sent to pharmacy.

## 2021-04-22 NOTE — Telephone Encounter (Signed)
Spoke with patient and info given 

## 2021-04-22 NOTE — Telephone Encounter (Signed)
Patient calling in  Patient says she has switched from [place] to [facility] we should be receiving a PA from them for the [facility] 5 MG/0.5ML Pen  Patient also says she is suppose to be moving up to the 10MG  dosage & wants to know if provider can go ahead & send PA for this bc she is completely out & is due for an injection tomorrow.Marland Kitchen asking if she needs to sub & take Ozempic until PA is complet since there has already been an approved PA for this  Please call patient [phone]

## 2021-04-22 NOTE — Addendum Note (Signed)
Addended by: Binnie Rail on: 04/22/2021 04:32 PM   Modules accepted: Orders

## 2021-04-24 NOTE — Telephone Encounter (Signed)
Debra Le (Key: [id number])  Your information has been submitted to [facility]. To check for an updated outcome later, reopen this PA request from your dashboard.  If [facility] has not responded to your request within 24 hours, contact [place] at [phone]. If you think there may be a problem with your PA request, use our live chat feature at the bottom right.

## 2021-04-25 NOTE — Telephone Encounter (Signed)
Outcome Approvedon January 18 Your PA request has been approved. Additional information will be provided in the approval communication. (Message [phone]) Drug Mounjaro 7.5MG /0.5ML pen-injectors Form Charity fundraiser PA Form [phone] [place])

## 2021-04-25 NOTE — Telephone Encounter (Signed)
Patient informed of approval.  

## 2021-04-25 NOTE — Telephone Encounter (Signed)
Approved from 04/24/2021 - 04/24/2022

## 2021-05-10 ENCOUNTER — Telehealth: Payer: Self-pay | Admitting: Internal Medicine

## 2021-05-10 MED ORDER — TIRZEPATIDE 10 MG/0.5ML ~~LOC~~ SOAJ: 10.0000 mg | SUBCUTANEOUS | 0 refills | Status: DC

## 2021-05-10 NOTE — Telephone Encounter (Signed)
We can increase to 10 mg if she wants.  New rx sent to [facility] for 10 mg weekly

## 2021-05-10 NOTE — Telephone Encounter (Signed)
Spoke with patient today. 

## 2021-05-10 NOTE — Telephone Encounter (Signed)
Pt has a 6 mo f/u on 2-27, pt inquiring if she needs to be seen sooner for an increase of mounjaro, pt states she will be out of the medication on 2-6  Pt requesting a c/b

## 2021-05-14 ENCOUNTER — Other Ambulatory Visit: Payer: Self-pay | Admitting: Internal Medicine

## 2021-05-24 MED ORDER — TIRZEPATIDE 7.5 MG/0.5ML ~~LOC~~ SOAJ: 7.5000 mg | SUBCUTANEOUS | 2 refills | Status: DC

## 2021-05-24 NOTE — Telephone Encounter (Signed)
Patient calling in  Patient says [facility] is currently out of the tirzepatide [facility]) 10 MG/0.5ML Pen & wants to know if provider is able to write another rx for the 7.5mg  until they are able to get it in stock  Pharmacy  [id number] [place], [place] - [address] Phone:  [phone]  Fax:  [phone]

## 2021-05-24 NOTE — Telephone Encounter (Signed)
sent 

## 2021-05-24 NOTE — Addendum Note (Signed)
Addended by: Binnie Rail on: 05/24/2021 09:13 AM   Modules accepted: Orders

## 2021-06-02 ENCOUNTER — Encounter: Payer: Self-pay | Admitting: Internal Medicine

## 2021-06-02 NOTE — Patient Instructions (Addendum)
° ° ° °  Blood work was ordered.     Medications changes include :      Your prescription(s) have been sent to your pharmacy.    A referral was ordered for XX.     Someone from that office will call you to schedule an appointment.    Return in about 6 months (around 12/01/2021) for CPE.

## 2021-06-02 NOTE — Progress Notes (Signed)
Subjective:    Patient ID: Debra Le, female    DOB: 02-May-1975, 46 y.o.   MRN: [id number]  This visit occurred during the SARS-CoV-2 public health emergency.  Safety protocols were in place, including screening questions prior to the visit, additional usage of staff PPE, and extensive cleaning of exam room while observing appropriate contact time as indicated for disinfecting solutions.     HPI The patient is here for follow up of their chronic medical problems, including DM, htn, hld, GERD, depression, anxiety, obesity    Medications and allergies reviewed with patient and updated if appropriate.  Patient Active Problem List   Diagnosis Date Noted   Lack of energy 11/30/2020   Uterine leiomyoma 09/05/2020   History of long-term treatment with high-risk medication 05/31/2020   Shingles 04/16/2020   Iron deficiency 04/23/2018   Verruca pedis 02/07/2018   Hyperpigmented skin lesion 01/10/2018   Skin abnormality 12/25/2017   Other hyperlipidemia 03/23/2017   Endometriosis 06/13/2016   Depression 06/13/2016   Diabetes ([place]) 09/20/2015   Keloid 09/19/2015   Lump of skin 09/19/2015   Migraines 09/19/2015   Essential hypertension, benign 03/09/2015   GERD (gastroesophageal reflux disease) 03/09/2015   Anxiety 03/09/2015   Vitamin D deficiency 03/09/2015   Morbid obesity ([place]) 03/09/2015    Current Outpatient Medications on File Prior to Visit  Medication Sig Dispense Refill   amLODipine (NORVASC) 10 MG tablet TAKE 1 TABLET(10 MG) BY MOUTH DAILY 90 tablet 1   atorvastatin (LIPITOR) 20 MG tablet TAKE 1 TABLET(20 MG) BY MOUTH DAILY 90 tablet 1   blood glucose meter kit and supplies KIT Dispense based on patient and insurance preference. Use up to four times daily as directed. 1 each 0   buPROPion ER (WELLBUTRIN SR) 100 MG 12 hr tablet TAKE 1 TABLET(100 MG) BY MOUTH TWICE DAILY 180 tablet 1   escitalopram (LEXAPRO) 10 MG tablet TAKE 1 AND 1/2 TABLETS(15 MG) BY MOUTH  DAILY 135 tablet 1   esomeprazole (NEXIUM) 40 MG capsule TAKE 1 CAPSULE(40 MG) BY MOUTH DAILY 90 capsule 1   metFORMIN (GLUCOPHAGE) 500 MG tablet TAKE 1 TABLET(500 MG) BY MOUTH TWICE DAILY WITH A MEAL 180 tablet 1   metoprolol tartrate (LOPRESSOR) 50 MG tablet TAKE 1 TABLET(50 MG) BY MOUTH TWICE DAILY 180 tablet 1   tirzepatide (MOUNJARO) 7.5 MG/0.5ML Pen Inject 7.5 mg into the skin once a week. 2 mL 2   No current facility-administered medications on file prior to visit.    Past Medical History:  Diagnosis Date   Abnormal uterine bleeding (AUB)    Anxiety    COVID 04/2019   cough x 7 days all symptoms reoslved   Depression    DM type 2 (diabetes mellitus, type 2) (HCC)    GERD (gastroesophageal reflux disease)    Heart murmur    mild no cardiologist   Hypertension    Obesity    Palpitation    occ no cardiologist   Wears glasses    for reading    Past Surgical History:  Procedure Laterality Date   CESAREAN SECTION  02/21/2005   with Tubal ligation   CYSTOSCOPY N/A 09/05/2020   Procedure: CYSTOSCOPY;  Surgeon: Cheri Fowler, MD;  Location: [facility];  Service: Gynecology;  Laterality: N/A;  possible   TOTAL LAPAROSCOPIC HYSTERECTOMY WITH SALPINGECTOMY Bilateral 09/05/2020   Procedure: TOTAL LAPAROSCOPIC HYSTERECTOMY WITH SALPINGECTOMY; REMOVAL ABDOMINAL WALL NODULE;  Surgeon: Cheri Fowler, MD;  Location: [facility];  Service: Gynecology;  Laterality: Bilateral;    Social History   Socioeconomic History   Marital status: Married    Spouse name: Not on file   Number of children: 2   Years of education: Not on file   Highest education level: Not on file  Occupational History   Occupation: RN  Tobacco Use   Smoking status: Never   Smokeless tobacco: Never  Vaping Use   Vaping Use: Never used  Substance and Sexual Activity   Alcohol use: Yes    Alcohol/week: 7.0 standard drinks    Types: 7 Standard drinks or equivalent per week     Comment: occ wine   Drug use: No   Sexual activity: Not on file  Other Topics Concern   Not on file  Social History Narrative   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 BP Readings from Last 3 Encounters:  11/30/20 130/72  09/05/20 (!) 141/65  08/27/20 133/75   Wt Readings from Last 3 Encounters:  11/30/20 (!) 319 lb (144.7 kg)  09/05/20 (!) 315 lb 14.4 oz (143.3 kg)  08/27/20 (!) 316 lb (143.3 kg)   There is no height or weight on file to calculate BMI.   Physical Exam       Lab Results  Component Value Date   WBC 8.5 08/27/2020   HGB 11.2 (L) 08/27/2020   HCT 34.9 (L) 08/27/2020   PLT 380 08/27/2020   GLUCOSE 78 11/30/2020   CHOL 142 11/30/2020   TRIG 59.0 11/30/2020   HDL 35.20 (L) 11/30/2020   LDLCALC 95 11/30/2020   ALT 19 11/30/2020   AST 19 11/30/2020   NA 138 11/30/2020   K 3.7 11/30/2020   CL 105 11/30/2020   CREATININE 0.86 11/30/2020   BUN 8 11/30/2020   CO2 27 11/30/2020   TSH 1.52 11/25/2019   HGBA1C 6.4 11/30/2020   MICROALBUR 1.5 11/30/2020     MM DIAG BREAST TOMO UNI LEFT CLINICAL DATA:  Recall from screening mammography, possible asymmetry in the outer LEFT breast at middle to posterior depth visible only on the CC view.  EXAM: DIGITAL DIAGNOSTIC UNILATERAL LEFT MAMMOGRAM WITH TOMOSYNTHESIS AND CAD  TECHNIQUE: Left digital diagnostic mammography and  breast tomosynthesis was performed. The images were evaluated with computer-aided detection.  COMPARISON:  Previous exam(s).  ACR Breast Density Category a: The breast tissue is almost entirely fatty.  FINDINGS: Spot-compression CC view of the area of concern a full field mediolateral view were obtained.  The asymmetry in the outer breast at middle to posterior depth disperses with compression, indicating overlapping fibroglandular tissue. There is no underlying mass or architectural distortion.  No findings suspicious for  malignancy.  IMPRESSION: No mammographic evidence of malignancy involving the LEFT breast.  RECOMMENDATION: Screening mammogram in one year.(Code:SM-B-01Y)  I have discussed the findings and recommendations with the patient. If applicable, a reminder letter will be sent to the patient regarding the next appointment.  BI-RADS CATEGORY  1: Negative.  Electronically Signed   By: Evangeline Dakin M.D.   On: 02/08/2021 14:41   Assessment & Plan:    See Problem List for Assessment and Plan of chronic medical problems.    This encounter was created in error - please disregard.

## 2021-06-03 ENCOUNTER — Encounter: Payer: 59 | Admitting: Internal Medicine

## 2021-06-03 DIAGNOSIS — I10 Essential (primary) hypertension: Secondary | ICD-10-CM

## 2021-06-03 DIAGNOSIS — F3289 Other specified depressive episodes: Secondary | ICD-10-CM

## 2021-06-03 DIAGNOSIS — K219 Gastro-esophageal reflux disease without esophagitis: Secondary | ICD-10-CM

## 2021-06-03 DIAGNOSIS — E7849 Other hyperlipidemia: Secondary | ICD-10-CM

## 2021-06-03 DIAGNOSIS — E559 Vitamin D deficiency, unspecified: Secondary | ICD-10-CM

## 2021-06-03 DIAGNOSIS — E1169 Type 2 diabetes mellitus with other specified complication: Secondary | ICD-10-CM

## 2021-06-03 DIAGNOSIS — F419 Anxiety disorder, unspecified: Secondary | ICD-10-CM

## 2021-06-03 NOTE — Progress Notes (Signed)
Subjective:    Patient ID: Debra Le, female    DOB: [birth year], 46 y.o.   MRN: [id number]  This visit occurred during the SARS-CoV-2 public health emergency.  Safety protocols were in place, including screening questions prior to the visit, additional usage of staff PPE, and extensive cleaning of exam room while observing appropriate contact time as indicated for disinfecting solutions.     HPI The patient is here for follow up of their chronic medical problems, including DM, htn, hld, GERD, depression, anxiety, obesity  She is walking 3 miles a day.  She feels less hungry and does not have the craving for sweets.    Medications and allergies reviewed with patient and updated if appropriate.  Patient Active Problem List   Diagnosis Date Noted   Lack of energy 11/30/2020   Uterine leiomyoma 09/05/2020   History of long-term treatment with high-risk medication 05/31/2020   Shingles 04/16/2020   Iron deficiency 04/23/2018   Verruca pedis 02/07/2018   Hyperpigmented skin lesion 01/10/2018   Skin abnormality 12/25/2017   Other hyperlipidemia 03/23/2017   Endometriosis 06/13/2016   Depression 06/13/2016   Diabetes ([place]) 09/20/2015   Keloid 09/19/2015   Lump of skin 09/19/2015   Migraines 09/19/2015   Essential hypertension, benign 03/09/2015   GERD (gastroesophageal reflux disease) 03/09/2015   Anxiety 03/09/2015   Vitamin D deficiency 03/09/2015   Morbid obesity ([place]) 03/09/2015    Current Outpatient Medications on File Prior to Visit  Medication Sig Dispense Refill   amLODipine (NORVASC) 10 MG tablet TAKE 1 TABLET(10 MG) BY MOUTH DAILY 90 tablet 1   atorvastatin (LIPITOR) 20 MG tablet TAKE 1 TABLET(20 MG) BY MOUTH DAILY 90 tablet 1   blood glucose meter kit and supplies KIT Dispense based on patient and insurance preference. Use up to four times daily as directed. 1 each 0   buPROPion ER (WELLBUTRIN SR) 100 MG 12 hr tablet TAKE 1 TABLET(100 MG) BY MOUTH TWICE DAILY  180 tablet 1   escitalopram (LEXAPRO) 10 MG tablet TAKE 1 AND 1/2 TABLETS(15 MG) BY MOUTH DAILY 135 tablet 1   esomeprazole (NEXIUM) 40 MG capsule TAKE 1 CAPSULE(40 MG) BY MOUTH DAILY 90 capsule 1   metFORMIN (GLUCOPHAGE) 500 MG tablet TAKE 1 TABLET(500 MG) BY MOUTH TWICE DAILY WITH A MEAL 180 tablet 1   metoprolol tartrate (LOPRESSOR) 50 MG tablet TAKE 1 TABLET(50 MG) BY MOUTH TWICE DAILY 180 tablet 1   tirzepatide (MOUNJARO) 7.5 MG/0.5ML Pen Inject 7.5 mg into the skin once a week. 2 mL 2   No current facility-administered medications on file prior to visit.    Past Medical History:  Diagnosis Date   Abnormal uterine bleeding (AUB)    Anxiety    COVID 04/2019   cough x 7 days all symptoms reoslved   Depression    DM type 2 (diabetes mellitus, type 2) (HCC)    GERD (gastroesophageal reflux disease)    Heart murmur    mild no cardiologist   Hypertension    Obesity    Palpitation    occ no cardiologist   Wears glasses    for reading    Past Surgical History:  Procedure Laterality Date   CESAREAN SECTION  02/21/2005   with Tubal ligation   CYSTOSCOPY N/A 09/05/2020   Procedure: CYSTOSCOPY;  Surgeon: Cheri Fowler, MD;  Location: [facility];  Service: Gynecology;  Laterality: N/A;  possible   TOTAL LAPAROSCOPIC HYSTERECTOMY WITH SALPINGECTOMY Bilateral 09/05/2020  Procedure: TOTAL LAPAROSCOPIC HYSTERECTOMY WITH SALPINGECTOMY; REMOVAL ABDOMINAL WALL NODULE;  Surgeon: Cheri Fowler, MD;  Location: [facility];  Service: Gynecology;  Laterality: Bilateral;    Social History   Socioeconomic History   Marital status: Married    Spouse name: Not on file   Number of children: 2   Years of education: Not on file   Highest education level: Not on file  Occupational History   Occupation: RN  Tobacco Use   Smoking status: Never   Smokeless tobacco: Never  Vaping Use   Vaping Use: Never used  Substance and Sexual Activity   Alcohol use: Yes     Alcohol/week: 7.0 standard drinks    Types: 7 Standard drinks or equivalent per week    Comment: occ wine   Drug use: No   Sexual activity: Not on file  Other Topics Concern   Not on file  Social History 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  Respiratory:  Negative for cough, shortness of breath and wheezing.   Cardiovascular:  Negative for chest pain, palpitations and leg swelling.  Neurological:  Negative for light-headedness and headaches.      Objective:   Vitals:   06/04/21 1559  BP: 134/80  Pulse: 79  Temp: 98 F (36.7 C)  SpO2: 99%   BP Readings from Last 3 Encounters:  06/04/21 134/80  11/30/20 130/72  09/05/20 (!) 141/65   Wt Readings from Last 3 Encounters:  06/04/21 (!) 308 lb (139.7 kg)  11/30/20 (!) 319 lb (144.7 kg)  09/05/20 (!) 315 lb 14.4 oz (143.3 kg)   Body mass index is 44.19 kg/m.   Physical Exam Constitutional:      General: She is not in acute distress.    Appearance: Normal appearance.  HENT:     Head: Normocephalic and atraumatic.  Eyes:     Conjunctiva/sclera: Conjunctivae normal.  Cardiovascular:     Rate and Rhythm: Normal rate and regular rhythm.     Heart sounds: Normal heart sounds. No murmur heard. Pulmonary:     Effort: Pulmonary effort is normal. No respiratory distress.     Breath sounds: Normal breath sounds. No wheezing.   Musculoskeletal:     Cervical back: Neck supple.     Right lower leg: No edema.     Left lower leg: No edema.  Lymphadenopathy:     Cervical: No cervical adenopathy.  Skin:    Findings: No rash.  Neurological:     Mental Status: She is alert. Mental status is at baseline.  Psychiatric:        Mood and Affect: Mood normal.        Behavior: Behavior normal.  Lab Results  °Component Value Date  ° WBC 8.5 08/27/2020  ° HGB 11.2 (L) 08/27/2020  ° HCT 34.9 (L) 08/27/2020  ° PLT 380 08/27/2020  ° GLUCOSE 78 11/30/2020  ° CHOL 142 11/30/2020  ° TRIG 59.0 11/30/2020  ° HDL 35.20 (L) 11/30/2020  ° LDLCALC 95 11/30/2020  ° ALT 19 11/30/2020  ° AST 19 11/30/2020  ° NA 138 11/30/2020  ° K 3.7 11/30/2020  ° CL 105 11/30/2020  ° CREATININE 0.86 11/30/2020  ° BUN 8 11/30/2020  ° CO2 27 11/30/2020  ° TSH 1.52 11/25/2019  ° HGBA1C 6.4 11/30/2020  ° MICROALBUR 1.5 11/30/2020  ° ° ° °MM DIAG BREAST TOMO UNI LEFT °CLINICAL DATA:  Recall from screening mammography, possible °asymmetry in the outer LEFT breast at middle to posterior depth °visible only on the CC view. ° °EXAM: °DIGITAL DIAGNOSTIC UNILATERAL LEFT MAMMOGRAM WITH TOMOSYNTHESIS AND °CAD ° °TECHNIQUE: °Left digital diagnostic mammography and breast tomosynthesis was °performed. The images were evaluated with computer-aided detection. ° °COMPARISON:  Previous exam(s). ° °ACR Breast Density Category a: The breast tissue is almost entirely °fatty. ° °FINDINGS: °Spot-compression CC view of the area of concern a full field °mediolateral view were obtained. ° °The asymmetry in the outer breast at middle to posterior depth °disperses with compression, indicating overlapping fibroglandular °tissue. There is no underlying mass or architectural distortion. ° °No findings suspicious for malignancy. ° °IMPRESSION: °No mammographic evidence of malignancy involving the LEFT breast. ° °RECOMMENDATION: °Screening mammogram in one year.(Code:SM-B-01Y) ° °I have  discussed the findings and recommendations with the patient. °If applicable, a reminder letter will be sent to the patient °regarding the next appointment. ° °BI-RADS CATEGORY  1: Negative. ° °Electronically Signed °  By: Thomas  Lawrence M.D. °  On: 02/08/2021 14:41 ° ° °Assessment & Plan:  ° ° °See Problem List for Assessment and Plan of chronic medical problems.  ° ° ° °

## 2021-06-03 NOTE — Patient Instructions (Addendum)
° ° ° °  Blood work was ordered.     Medications changes include :   mounjaro 10 mg weekly   Your prescription(s) have been sent to your pharmacy.     Return in about 6 months (around 12/02/2021) for CPE.

## 2021-06-04 ENCOUNTER — Encounter: Payer: Self-pay | Admitting: Internal Medicine

## 2021-06-04 ENCOUNTER — Other Ambulatory Visit: Payer: Self-pay

## 2021-06-04 ENCOUNTER — Ambulatory Visit (INDEPENDENT_AMBULATORY_CARE_PROVIDER_SITE_OTHER): Payer: No Typology Code available for payment source | Admitting: Internal Medicine

## 2021-06-04 VITALS — BP 134/80 | HR 79 | Temp 98.0°F | Ht 70.0 in | Wt 308.0 lb

## 2021-06-04 DIAGNOSIS — F419 Anxiety disorder, unspecified: Secondary | ICD-10-CM | POA: Diagnosis not present

## 2021-06-04 DIAGNOSIS — F3289 Other specified depressive episodes: Secondary | ICD-10-CM | POA: Diagnosis not present

## 2021-06-04 DIAGNOSIS — I10 Essential (primary) hypertension: Secondary | ICD-10-CM

## 2021-06-04 DIAGNOSIS — E7849 Other hyperlipidemia: Secondary | ICD-10-CM

## 2021-06-04 DIAGNOSIS — Z1211 Encounter for screening for malignant neoplasm of colon: Secondary | ICD-10-CM

## 2021-06-04 DIAGNOSIS — E559 Vitamin D deficiency, unspecified: Secondary | ICD-10-CM | POA: Diagnosis not present

## 2021-06-04 DIAGNOSIS — E1169 Type 2 diabetes mellitus with other specified complication: Secondary | ICD-10-CM

## 2021-06-04 DIAGNOSIS — K219 Gastro-esophageal reflux disease without esophagitis: Secondary | ICD-10-CM

## 2021-06-04 MED ORDER — METOPROLOL TARTRATE 50 MG PO TABS: ORAL_TABLET | ORAL | 1 refills | Status: DC

## 2021-06-04 MED ORDER — AMLODIPINE BESYLATE 10 MG PO TABS: ORAL_TABLET | ORAL | 1 refills | Status: DC

## 2021-06-04 MED ORDER — ESOMEPRAZOLE MAGNESIUM 40 MG PO CPDR: DELAYED_RELEASE_CAPSULE | ORAL | 1 refills | Status: DC

## 2021-06-04 MED ORDER — BUPROPION HCL ER (SR) 100 MG PO TB12: ORAL_TABLET | ORAL | 1 refills | Status: DC

## 2021-06-04 MED ORDER — ATORVASTATIN CALCIUM 20 MG PO TABS: ORAL_TABLET | ORAL | 1 refills | Status: DC

## 2021-06-04 MED ORDER — METFORMIN HCL 500 MG PO TABS: ORAL_TABLET | ORAL | 1 refills | Status: DC

## 2021-06-04 MED ORDER — TIRZEPATIDE 10 MG/0.5ML ~~LOC~~ SOAJ: 10.0000 mg | SUBCUTANEOUS | 0 refills | Status: DC

## 2021-06-04 NOTE — Assessment & Plan Note (Signed)
Chronic Controlled, Stable Continue bupropion SR 100 mg twice daily, Lexapro 15 mg daily

## 2021-06-04 NOTE — Assessment & Plan Note (Signed)
Chronic GERD controlled Continue Nexium 40 mg daily 

## 2021-06-04 NOTE — Assessment & Plan Note (Signed)
Chronic Blood pressure well controlled CMP Continue metoprolol 50 mg twice daily, amlodipine 10 mg daily 

## 2021-06-04 NOTE — Assessment & Plan Note (Signed)
Chronic °Regular exercise and healthy diet encouraged °Check lipid panel  °Continue atorvastatin 20 mg daily °

## 2021-06-04 NOTE — Assessment & Plan Note (Addendum)
Chronic Lab Results  Component Value Date   HGBA1C 6.4 11/30/2020   Has been controlled Continue increase Mounjaro to 10 mg weekly-depending on cost and how it may stay on 10 mg Gherghe decrease back to 7.5 mg Can consider discontinuing metformin 500 mg twice daily Check A1c

## 2021-06-04 NOTE — Assessment & Plan Note (Signed)
Chronic Taking vitamin D daily Check vitamin D level  

## 2021-06-04 NOTE — Assessment & Plan Note (Addendum)
Chronic Currently taking Mounjaro 7.5 mg weekly-we will increase to 10 mg weekly Continue decrease portions, regular exercise

## 2021-06-05 LAB — COMPREHENSIVE METABOLIC PANEL
ALT: 17 U/L (ref 0–35)
AST: 22 U/L (ref 0–37)
Albumin: 4.2 g/dL (ref 3.5–5.2)
Alkaline Phosphatase: 128 U/L — ABNORMAL HIGH (ref 39–117)
BUN: 10 mg/dL (ref 6–23)
CO2: 25 mEq/L (ref 19–32)
Calcium: 9.4 mg/dL (ref 8.4–10.5)
Chloride: 104 mEq/L (ref 96–112)
Creatinine, Ser: 0.97 mg/dL (ref 0.40–1.20)
GFR: 70.75 mL/min (ref >60.00)
Glucose, Bld: 82 mg/dL (ref 70–99)
Potassium: 4 mEq/L (ref 3.5–5.1)
Sodium: 136 mEq/L (ref 135–145)
Total Bilirubin: 0.6 mg/dL (ref 0.2–1.2)
Total Protein: 8 g/dL (ref 6.0–8.3)

## 2021-06-05 LAB — LIPID PANEL
Cholesterol: 130 mg/dL (ref 0–200)
HDL: 31 mg/dL — ABNORMAL LOW (ref >39.00)
LDL Cholesterol: 89 mg/dL (ref 0–99)
NonHDL: 98.93
Total CHOL/HDL Ratio: 4
Triglycerides: 51 mg/dL (ref 0.0–149.0)
VLDL: 10.2 mg/dL (ref 0.0–40.0)

## 2021-06-05 LAB — VITAMIN D 25 HYDROXY (VIT D DEFICIENCY, FRACTURES): VITD: 42.48 ng/mL (ref 30.00–100.00)

## 2021-06-05 LAB — HEMOGLOBIN A1C: Hgb A1c MFr Bld: 6 % (ref 4.6–6.5)

## 2021-06-08 ENCOUNTER — Other Ambulatory Visit: Payer: Self-pay | Admitting: Internal Medicine

## 2021-06-21 ENCOUNTER — Other Ambulatory Visit: Payer: Self-pay | Admitting: Internal Medicine

## 2021-07-31 ENCOUNTER — Telehealth: Payer: Self-pay | Admitting: Internal Medicine

## 2021-07-31 ENCOUNTER — Ambulatory Visit (INDEPENDENT_AMBULATORY_CARE_PROVIDER_SITE_OTHER): Payer: No Typology Code available for payment source

## 2021-07-31 ENCOUNTER — Other Ambulatory Visit: Payer: Self-pay

## 2021-07-31 DIAGNOSIS — Z111 Encounter for screening for respiratory tuberculosis: Secondary | ICD-10-CM

## 2021-07-31 MED ORDER — TIRZEPATIDE 10 MG/0.5ML ~~LOC~~ SOAJ: 10.0000 mg | SUBCUTANEOUS | 0 refills | Status: DC

## 2021-07-31 NOTE — Telephone Encounter (Signed)
Sent in today for patient. 

## 2021-07-31 NOTE — Telephone Encounter (Signed)
1.Medication Requested: tirzepatide Debra Le) 10 MG/0.5ML Pen ? ?2. Pharmacy (Name, Street, [place]): [id number] [place], [place]- [address]? ?3. On Med List: Y ? ?4. Last Visit with PCP: 06-04-2021 ? ?5. Next visit date with PCP: 12-06-2021 ? ? ?Agent: Please be advised that RX refills may take up to 3 business days. We ask that you follow-up with your pharmacy.  ?

## 2021-07-31 NOTE — Progress Notes (Signed)
Patient in office for PPD, will return in two days for reading. ?

## 2021-08-02 ENCOUNTER — Ambulatory Visit: Payer: No Typology Code available for payment source | Admitting: *Deleted

## 2021-08-02 LAB — TB SKIN TEST
Induration: 0 mm
TB Skin Test: NEGATIVE

## 2021-08-02 NOTE — Progress Notes (Signed)
Patient here to get her TB skin test read. TB skin test negative. ? ?Please co sign  ?

## 2021-08-11 ENCOUNTER — Other Ambulatory Visit: Payer: Self-pay | Admitting: Internal Medicine

## 2021-11-28 ENCOUNTER — Other Ambulatory Visit: Payer: Self-pay | Admitting: Internal Medicine

## 2021-11-30 ENCOUNTER — Other Ambulatory Visit: Payer: Self-pay | Admitting: Internal Medicine

## 2021-12-06 ENCOUNTER — Encounter: Payer: No Typology Code available for payment source | Admitting: Internal Medicine

## 2021-12-10 ENCOUNTER — Telehealth: Payer: Self-pay | Admitting: Internal Medicine

## 2021-12-10 ENCOUNTER — Other Ambulatory Visit: Payer: Self-pay

## 2021-12-10 MED ORDER — TIRZEPATIDE 12.5 MG/0.5ML ~~LOC~~ SOAJ: 12.5000 mg | SUBCUTANEOUS | 0 refills | Status: DC

## 2021-12-10 NOTE — Telephone Encounter (Signed)
Script sent in today. 

## 2021-12-10 NOTE — Telephone Encounter (Signed)
Patient needs mounjaro 12.5 mg. [facility] to [facility] [address] - patient would like 3 month supply.,  Patient has appointment on 9/.03/2022.

## 2021-12-16 ENCOUNTER — Encounter: Payer: Self-pay | Admitting: Internal Medicine

## 2021-12-16 NOTE — Progress Notes (Unsigned)
Subjective:    Patient ID: Debra Le, female    DOB: 1976-03-07, 46 y.o.   MRN: [id number]      HPI Debra Le is here for a Physical exam.   She is seeing a therapist.   She is trying to get a better work-life balance.  She feels a little less tired.  Overall feels well.    Medications and allergies reviewed with patient and updated if appropriate.  Current Outpatient Medications on File Prior to Visit  Medication Sig Dispense Refill   amLODipine (NORVASC) 10 MG tablet TAKE 1 TABLET BY MOUTH EVERY DAY 90 tablet 1   atorvastatin (LIPITOR) 20 MG tablet TAKE 1 TABLET BY MOUTH EVERY DAY 90 tablet 1   blood glucose meter kit and supplies KIT Dispense based on patient and insurance preference. Use up to four times daily as directed. 1 each 0   buPROPion ER (WELLBUTRIN SR) 100 MG 12 hr tablet TAKE 1 TABLET BY MOUTH TWICE A DAY 180 tablet 1   escitalopram (LEXAPRO) 10 MG tablet TAKE 1 & 1/2 TABLET BY MOUTH DAILY 135 tablet 1   esomeprazole (NEXIUM) 40 MG capsule TAKE 1 CAPSULE(40 MG) BY MOUTH DAILY 90 capsule 1   metFORMIN (GLUCOPHAGE) 500 MG tablet TAKE 1 TABLET(500 MG) BY MOUTH TWICE DAILY WITH A MEAL 180 tablet 1   metoprolol tartrate (LOPRESSOR) 50 MG tablet TAKE 1 TABLET BY MOUTH TWICE A DAY 180 tablet 1   tirzepatide (MOUNJARO) 12.5 MG/0.5ML Pen Inject 12.5 mg into the skin once a week. 6 mL 0   No current facility-administered medications on file prior to visit.    Review of Systems  Constitutional:  Negative for fever.  Eyes:  Negative for visual disturbance.  Respiratory:  Negative for cough, shortness of breath and wheezing.   Cardiovascular:  Negative for chest pain, palpitations and leg swelling.  Gastrointestinal:  Negative for abdominal pain, blood in stool, constipation, diarrhea and nausea.       No gerd  Genitourinary:  Negative for dysuria.  Musculoskeletal:  Negative for arthralgias and back pain.  Skin:  Negative for rash.  Neurological:  Negative for  light-headedness and headaches.  Psychiatric/Behavioral:  Positive for dysphoric mood. The patient is nervous/anxious.        Objective:   Vitals:   12/17/21 1554  BP: 122/84  Pulse: 82  Temp: 98.2 F (36.8 C)   Filed Weights   12/17/21 1554  Weight: 271 lb (122.9 kg)   Body mass index is 38.88 kg/m.  BP Readings from Last 3 Encounters:  12/17/21 122/84  06/04/21 134/80  11/30/20 130/72    Wt Readings from Last 3 Encounters:  12/17/21 271 lb (122.9 kg)  06/04/21 (!) 308 lb (139.7 kg)  11/30/20 (!) 319 lb (144.7 kg)       Physical Exam Constitutional: She appears well-developed and well-nourished. No distress.  HENT:  Head: Normocephalic and atraumatic.  Right Ear: External ear normal. Normal ear canal and TM Left Ear: External ear normal.  Normal ear canal and TM Mouth/Throat: Oropharynx is clear and moist.  Eyes: Conjunctivae normal.  Neck: Neck supple. No tracheal deviation present. No thyromegaly present.  No carotid bruit  Cardiovascular: Normal rate, regular rhythm and normal heart sounds.   No murmur heard.  No edema. Pulmonary/Chest: Effort normal and breath sounds normal. No respiratory distress. She has no wheezes. She has no rales.  Breast: deferred   Abdominal: Soft. She exhibits no distension. There is no tenderness.  Lymphadenopathy: She has no cervical adenopathy.  Skin: Skin is warm and dry. She is not diaphoretic.  Psychiatric: She has a normal mood and affect. Her behavior is normal.     Lab Results  Component Value Date   WBC 8.5 08/27/2020   HGB 11.2 (L) 08/27/2020   HCT 34.9 (L) 08/27/2020   PLT 380 08/27/2020   GLUCOSE 82 06/04/2021   CHOL 130 06/04/2021   TRIG 51.0 06/04/2021   HDL 31.00 (L) 06/04/2021   LDLCALC 89 06/04/2021   ALT 17 06/04/2021   AST 22 06/04/2021   NA 136 06/04/2021   K 4.0 06/04/2021   CL 104 06/04/2021   CREATININE 0.97 06/04/2021   BUN 10 06/04/2021   CO2 25 06/04/2021   TSH 1.52 11/25/2019    HGBA1C 6.0 06/04/2021   MICROALBUR 1.5 11/30/2020         Assessment & Plan:   Physical exam: Screening blood work  ordered Exercise  regular Weight  working on weight loss Substance abuse  none   Reviewed recommended immunizations.   Flu vaccine today.   Colonoscopy done at [facility] Date Due   COVID-19 Vaccine (2 - Pfizer risk series) 03/14/2020   OPHTHALMOLOGY EXAM  09/29/2020   FOOT EXAM  11/24/2020   COLONOSCOPY (Pts 45-43yr Insurance coverage will need to be confirmed)  Never done   INFLUENZA VACCINE  11/05/2021   Diabetic kidney evaluation - Urine ACR  11/30/2021   HEMOGLOBIN A1C  12/02/2021   Diabetic kidney evaluation - GFR measurement  06/04/2022   PAP SMEAR-Modifier  07/17/2022   MAMMOGRAM  01/16/2023   TETANUS/TDAP  04/23/2028   Hepatitis C Screening  Completed   HIV Screening  Completed   HPV VACCINES  Aged Out          See Problem List for Assessment and Plan of chronic medical problems.

## 2021-12-16 NOTE — Patient Instructions (Addendum)
Flu vaccine given today    Blood work was ordered.     Medications changes include :   none    Return in about 6 months (around 06/17/2022) for follow up.   Health Maintenance, Female Adopting a healthy lifestyle and getting preventive care are important in promoting health and wellness. Ask your health care provider about: The right schedule for you to have regular tests and exams. Things you can do on your own to prevent diseases and keep yourself healthy. What should I know about diet, weight, and exercise? Eat a healthy diet  Eat a diet that includes plenty of vegetables, fruits, low-fat dairy products, and lean protein. Do not eat a lot of foods that are high in solid fats, added sugars, or sodium. Maintain a healthy weight Body mass index (BMI) is used to identify weight problems. It estimates body fat based on height and weight. Your health care provider can help determine your BMI and help you achieve or maintain a healthy weight. Get regular exercise Get regular exercise. This is one of the most important things you can do for your health. Most adults should: Exercise for at least 150 minutes each week. The exercise should increase your heart rate and make you sweat (moderate-intensity exercise). Do strengthening exercises at least twice a week. This is in addition to the moderate-intensity exercise. Spend less time sitting. Even light physical activity can be beneficial. Watch cholesterol and blood lipids Have your blood tested for lipids and cholesterol at 45 years of age, then have this test every 5 years. Have your cholesterol levels checked more often if: Your lipid or cholesterol levels are high. You are older than 45 years of age. You are at high risk for heart disease. What should I know about cancer screening? Depending on your health history and family history, you may need to have cancer screening at various ages. This may include screening for: Breast  cancer. Cervical cancer. Colorectal cancer. Skin cancer. Lung cancer. What should I know about heart disease, diabetes, and high blood pressure? Blood pressure and heart disease High blood pressure causes heart disease and increases the risk of stroke. This is more likely to develop in people who have high blood pressure readings or are overweight. Have your blood pressure checked: Every 3-5 years if you are 62-38 years of age. Every year if you are 24 years old or older. Diabetes Have regular diabetes screenings. This checks your fasting blood sugar level. Have the screening done: Once every three years after age 48 if you are at a normal weight and have a low risk for diabetes. More often and at a younger age if you are overweight or have a high risk for diabetes. What should I know about preventing infection? Hepatitis B If you have a higher risk for hepatitis B, you should be screened for this virus. Talk with your health care provider to find out if you are at risk for hepatitis B infection. Hepatitis C Testing is recommended for: Everyone born from [birth year] through 1964. Anyone with known risk factors for hepatitis C. Sexually transmitted infections (STIs) Get screened for STIs, including gonorrhea and chlamydia, if: You are sexually active and are younger than 45 years of age. You are older than 45 years of age and your health care provider tells you that you are at risk for this type of infection. Your sexual activity has changed since you were last screened, and you are at increased risk for chlamydia or  gonorrhea. Ask your health care provider if you are at risk. Ask your health care provider about whether you are at high risk for HIV. Your health care provider may recommend a prescription medicine to help prevent HIV infection. If you choose to take medicine to prevent HIV, you should first get tested for HIV. You should then be tested every 3 months for as long as you are taking  the medicine. Pregnancy If you are about to stop having your period (premenopausal) and you may become pregnant, seek counseling before you get pregnant. Take 400 to 800 micrograms (mcg) of folic acid every day if you become pregnant. Ask for birth control (contraception) if you want to prevent pregnancy. Osteoporosis and menopause Osteoporosis is a disease in which the bones lose minerals and strength with aging. This can result in bone fractures. If you are 68 years old or older, or if you are at risk for osteoporosis and fractures, ask your health care provider if you should: Be screened for bone loss. Take a calcium or vitamin D supplement to lower your risk of fractures. Be given hormone replacement therapy (HRT) to treat symptoms of menopause. Follow these instructions at home: Alcohol use Do not drink alcohol if: Your health care provider tells you not to drink. You are pregnant, may be pregnant, or are planning to become pregnant. If you drink alcohol: Limit how much you have to: 0-1 drink a day. Know how much alcohol is in your drink. In the [place] one drink equals one 12 oz bottle of beer (355 mL), one 5 oz glass of wine (148 mL), or one 1 oz glass of hard liquor (44 mL). Lifestyle Do not use any products that contain nicotine or tobacco. These products include cigarettes, chewing tobacco, and vaping devices, such as e-cigarettes. If you need help quitting, ask your health care provider. Do not use street drugs. Do not share needles. Ask your health care provider for help if you need support or information about quitting drugs. General instructions Schedule regular health, dental, and eye exams. Stay current with your vaccines. Tell your health care provider if: You often feel depressed. You have ever been abused or do not feel safe at home. Summary Adopting a healthy lifestyle and getting preventive care are important in promoting health and wellness. Follow your health  care provider's instructions about healthy diet, exercising, and getting tested or screened for diseases. Follow your health care provider's instructions on monitoring your cholesterol and blood pressure. This information is not intended to replace advice given to you by your health care provider. Make sure you discuss any questions you have with your health care provider. Document Revised: 08/13/2020 Document Reviewed: 08/13/2020 Elsevier Patient Education  [place].

## 2021-12-17 ENCOUNTER — Ambulatory Visit (INDEPENDENT_AMBULATORY_CARE_PROVIDER_SITE_OTHER): Payer: No Typology Code available for payment source | Admitting: Internal Medicine

## 2021-12-17 VITALS — BP 122/84 | HR 82 | Temp 98.2°F | Ht 70.0 in | Wt 271.0 lb

## 2021-12-17 DIAGNOSIS — R7989 Other specified abnormal findings of blood chemistry: Secondary | ICD-10-CM

## 2021-12-17 DIAGNOSIS — K219 Gastro-esophageal reflux disease without esophagitis: Secondary | ICD-10-CM

## 2021-12-17 DIAGNOSIS — Z0000 Encounter for general adult medical examination without abnormal findings: Secondary | ICD-10-CM

## 2021-12-17 DIAGNOSIS — E1169 Type 2 diabetes mellitus with other specified complication: Secondary | ICD-10-CM

## 2021-12-17 DIAGNOSIS — F419 Anxiety disorder, unspecified: Secondary | ICD-10-CM

## 2021-12-17 DIAGNOSIS — E7849 Other hyperlipidemia: Secondary | ICD-10-CM | POA: Diagnosis not present

## 2021-12-17 DIAGNOSIS — F3289 Other specified depressive episodes: Secondary | ICD-10-CM

## 2021-12-17 DIAGNOSIS — E559 Vitamin D deficiency, unspecified: Secondary | ICD-10-CM

## 2021-12-17 DIAGNOSIS — Z23 Encounter for immunization: Secondary | ICD-10-CM

## 2021-12-17 DIAGNOSIS — I10 Essential (primary) hypertension: Secondary | ICD-10-CM

## 2021-12-17 NOTE — Assessment & Plan Note (Signed)
Chronic °Regular exercise and healthy diet encouraged °Check lipid panel  °Continue atorvastatin 20 mg daily °

## 2021-12-17 NOTE — Assessment & Plan Note (Signed)
Chronic  Lab Results  Component Value Date   HGBA1C 6.0 06/04/2021   Sugars well controlled Testing sugars 1 times a day Check A1c, urine microalbumin today Continue Mounjaro 12.5 mg weekly, metformin 500 mg twice daily Stressed regular exercise, diabetic diet

## 2021-12-17 NOTE — Assessment & Plan Note (Signed)
Chronic Controlled, Stable Continue Wellbutrin 100 mg twice daily and Lexapro 15 mg daily

## 2021-12-17 NOTE — Assessment & Plan Note (Signed)
Chronic Blood pressure well controlled CMP Continue metoprolol 50 mg twice daily, amlodipine 10 mg daily

## 2021-12-17 NOTE — Assessment & Plan Note (Signed)
Chronic Taking vitamin D daily Check vitamin D level  

## 2021-12-17 NOTE — Assessment & Plan Note (Signed)
Chronic GERD controlled Continue Nexium 40 mg daily 

## 2021-12-18 LAB — CBC WITH DIFFERENTIAL/PLATELET
Basophils Absolute: 0.1 10*3/uL (ref 0.0–0.1)
Basophils Relative: 1 % (ref 0.0–3.0)
Eosinophils Absolute: 0.1 10*3/uL (ref 0.0–0.7)
Eosinophils Relative: 1.1 % (ref 0.0–5.0)
HCT: 35.6 % — ABNORMAL LOW (ref 36.0–46.0)
Hemoglobin: 11.7 g/dL — ABNORMAL LOW (ref 12.0–15.0)
Lymphocytes Relative: 47.5 % — ABNORMAL HIGH (ref 12.0–46.0)
Lymphs Abs: 3.5 10*3/uL (ref 0.7–4.0)
MCHC: 32.9 g/dL (ref 30.0–36.0)
MCV: 87.6 fl (ref 78.0–100.0)
Monocytes Absolute: 0.5 10*3/uL (ref 0.1–1.0)
Monocytes Relative: 6.4 % (ref 3.0–12.0)
Neutro Abs: 3.2 10*3/uL (ref 1.4–7.7)
Neutrophils Relative %: 44 % (ref 43.0–77.0)
Platelets: 351 10*3/uL (ref 150.0–400.0)
RBC: 4.07 Mil/uL (ref 3.87–5.11)
RDW: 14.5 % (ref 11.5–15.5)
WBC: 7.4 10*3/uL (ref 4.0–10.5)

## 2021-12-18 LAB — COMPREHENSIVE METABOLIC PANEL
ALT: 47 U/L — ABNORMAL HIGH (ref 0–35)
AST: 132 U/L — ABNORMAL HIGH (ref 0–37)
Albumin: 4.1 g/dL (ref 3.5–5.2)
Alkaline Phosphatase: 132 U/L — ABNORMAL HIGH (ref 39–117)
BUN: 12 mg/dL (ref 6–23)
CO2: 27 mEq/L (ref 19–32)
Calcium: 9.6 mg/dL (ref 8.4–10.5)
Chloride: 101 mEq/L (ref 96–112)
Creatinine, Ser: 0.94 mg/dL (ref 0.40–1.20)
GFR: 73.19 mL/min (ref >60.00)
Glucose, Bld: 73 mg/dL (ref 70–99)
Potassium: 4.1 mEq/L (ref 3.5–5.1)
Sodium: 136 mEq/L (ref 135–145)
Total Bilirubin: 0.5 mg/dL (ref 0.2–1.2)
Total Protein: 8.2 g/dL (ref 6.0–8.3)

## 2021-12-18 LAB — LIPID PANEL
Cholesterol: 111 mg/dL (ref 0–200)
HDL: 33.9 mg/dL — ABNORMAL LOW (ref >39.00)
LDL Cholesterol: 68 mg/dL (ref 0–99)
NonHDL: 77.21
Total CHOL/HDL Ratio: 3
Triglycerides: 45 mg/dL (ref 0.0–149.0)
VLDL: 9 mg/dL (ref 0.0–40.0)

## 2021-12-18 LAB — MICROALBUMIN / CREATININE URINE RATIO
Creatinine,U: 250.9 mg/dL
Microalb Creat Ratio: 0.6 mg/g (ref 0.0–30.0)
Microalb, Ur: 1.4 mg/dL (ref 0.0–1.9)

## 2021-12-18 LAB — HEMOGLOBIN A1C: Hgb A1c MFr Bld: 5.9 % (ref 4.6–6.5)

## 2021-12-18 LAB — TSH: TSH: 1.09 u[IU]/mL (ref 0.35–5.50)

## 2021-12-18 LAB — VITAMIN D 25 HYDROXY (VIT D DEFICIENCY, FRACTURES): VITD: 47.89 ng/mL (ref 30.00–100.00)

## 2021-12-18 NOTE — Addendum Note (Signed)
Addended by: Binnie Rail on: 12/18/2021 09:27 PM   Modules accepted: Orders

## 2021-12-23 IMAGING — MG MM DIGITAL DIAGNOSTIC UNILAT*L* W/ TOMO W/ CAD
6 series · 6 of 18 positions shown · non-contrast
Comparison: Previous exam(s).

ACR Breast Density Category a: The breast tissue is almost entirely
fatty.

CLINICAL DATA: Recall from screening mammography, possible
asymmetry in the outer LEFT breast at middle to posterior depth
visible only on the CC view.

EXAM:
DIGITAL DIAGNOSTIC UNILATERAL LEFT MAMMOGRAM WITH TOMOSYNTHESIS AND
CAD
TECHNIQUE: Left digital diagnostic mammography and breast tomosynthesis was
performed. The images were evaluated with computer-aided detection.

[L CC synth-2D]
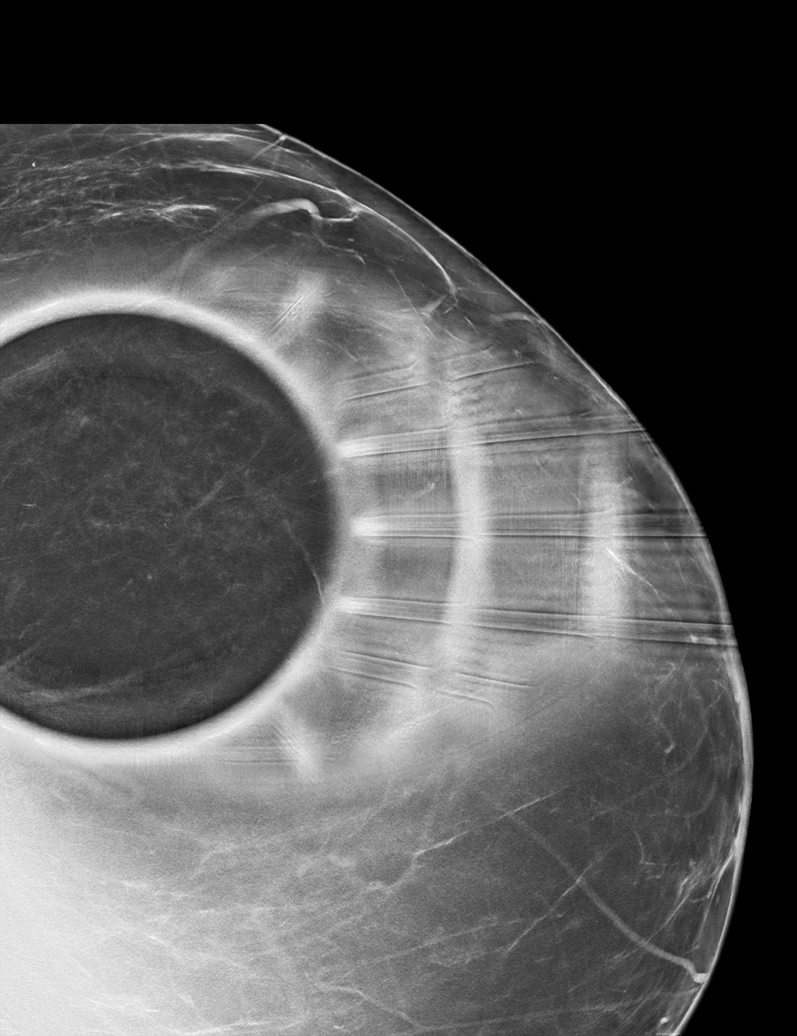

[L ML synth-2D (1 of 2)]
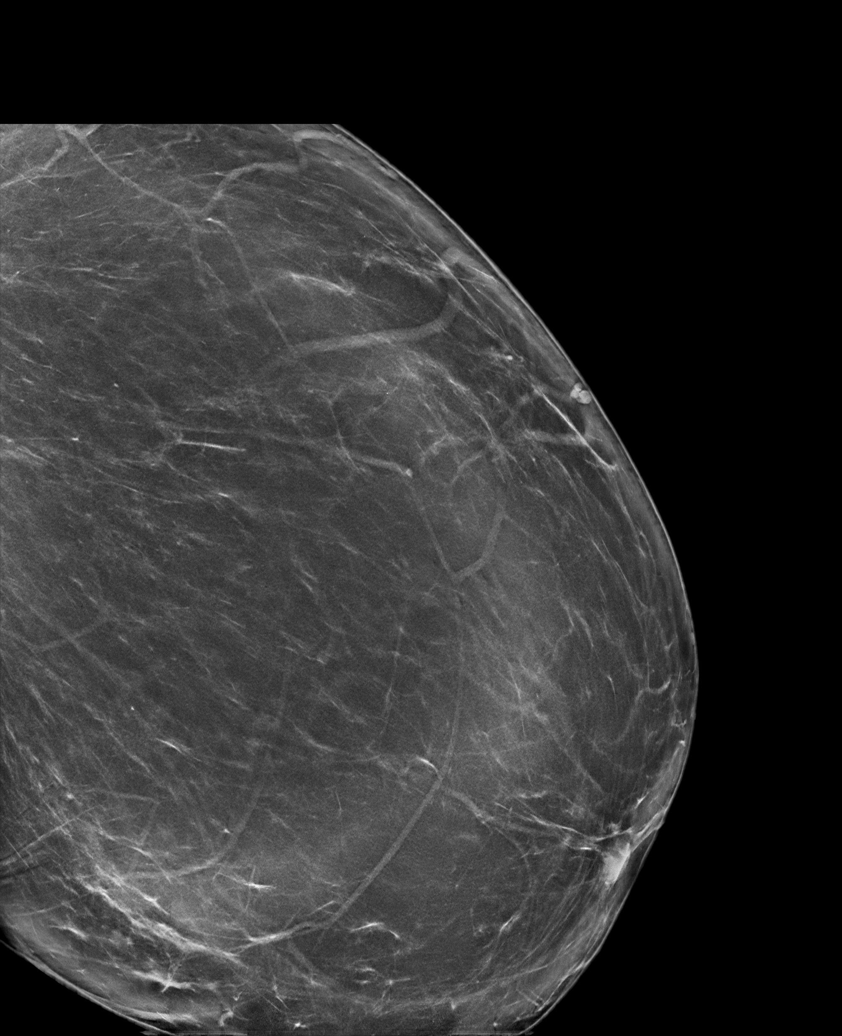

[L ML synth-2D (2 of 2)]
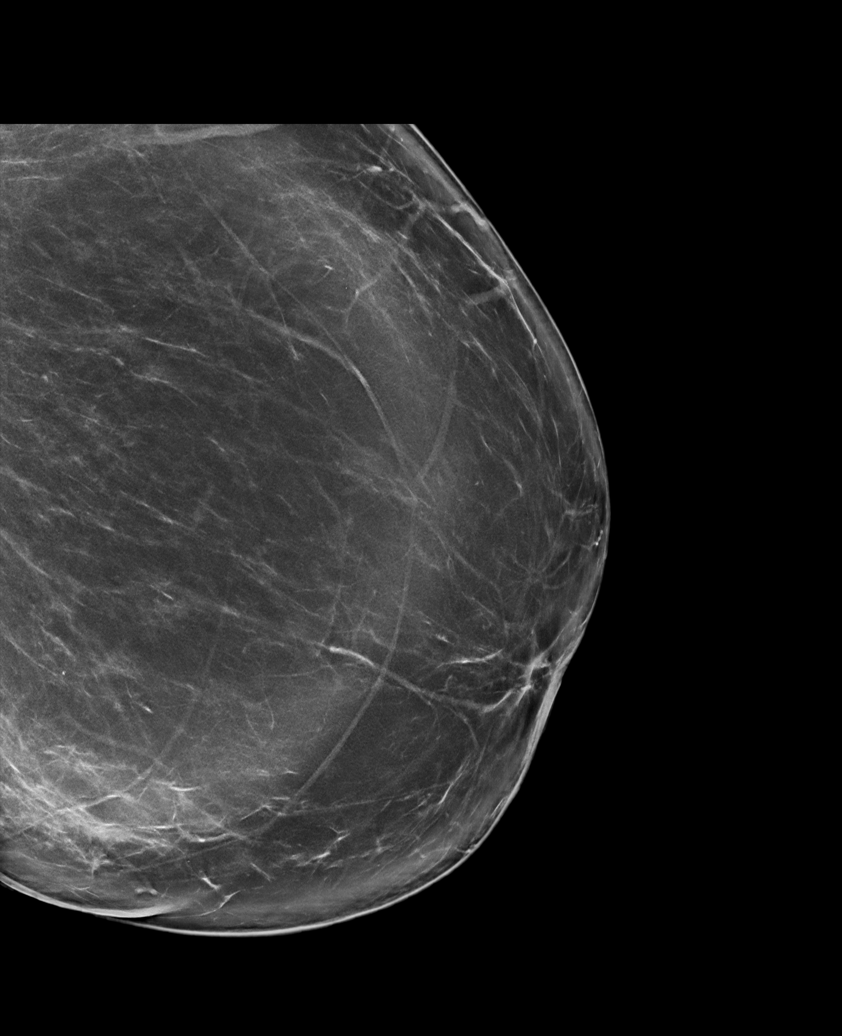

[L ML tomo (1 of 2) · tomo slice 53/104.0]
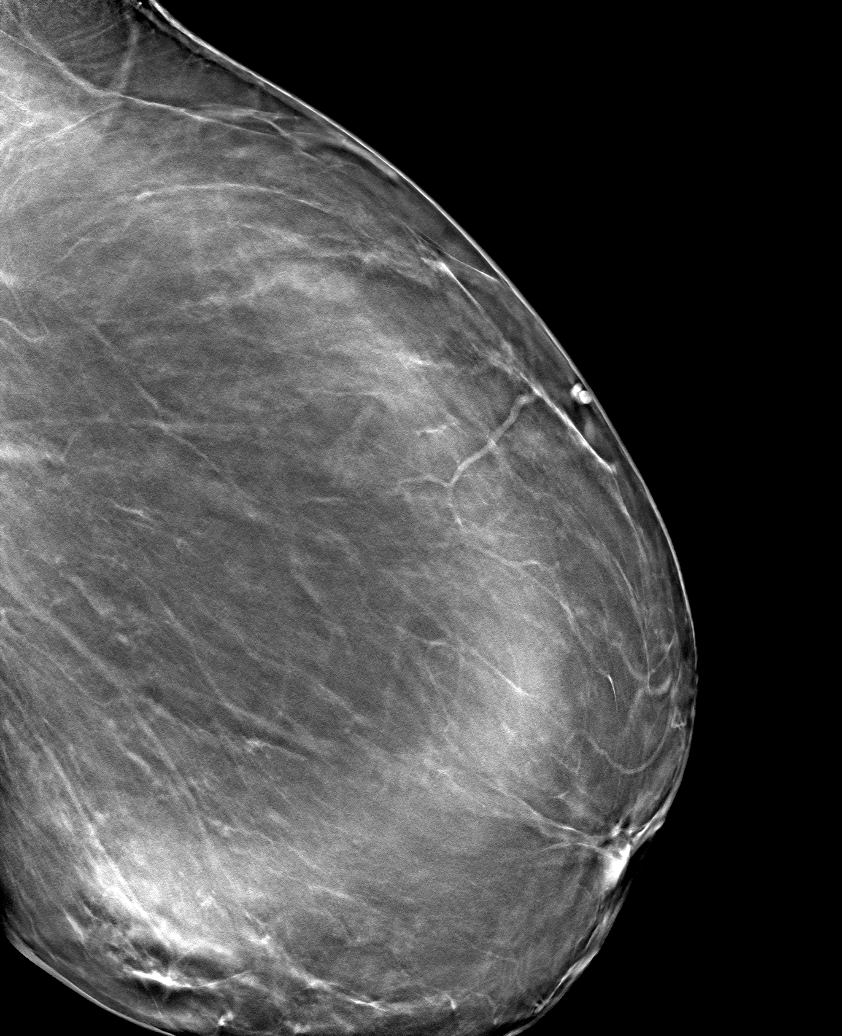

[L CC tomo · tomo slice 44/87.0]
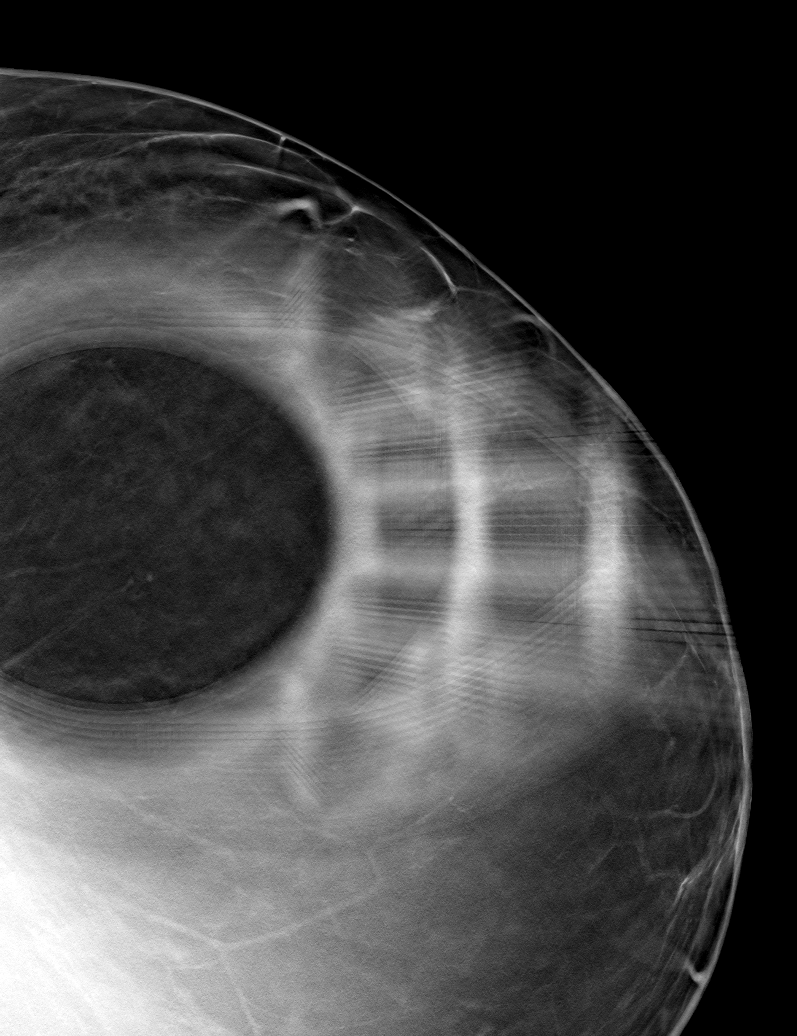

[L ML tomo (2 of 2) · tomo slice 51/101.0]
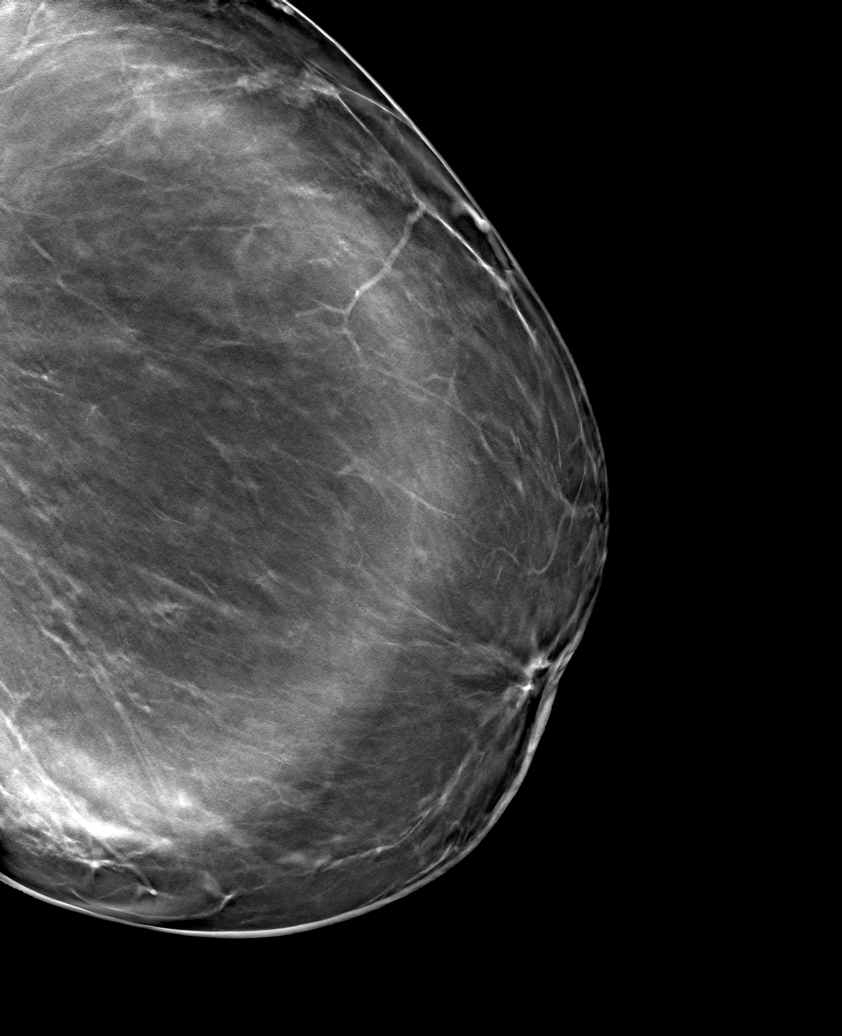

[6 of 18 positions shown; findings below may reference images not displayed]

FINDINGS: Spot-compression CC view of the area of concern a full field
mediolateral view were obtained.

The asymmetry in the outer breast at middle to posterior depth
disperses with compression, indicating overlapping fibroglandular
tissue. There is no underlying mass or architectural distortion.

No findings suspicious for malignancy.
IMPRESSION: No mammographic evidence of malignancy involving the LEFT breast.

RECOMMENDATION:
Screening mammogram in one year.(Code:LG-V-NA4)

I have discussed the findings and recommendations with the patient.
If applicable, a reminder letter will be sent to the patient
regarding the next appointment.

BI-RADS CATEGORY  1: Negative.

## 2022-01-10 NOTE — Telephone Encounter (Signed)
error 

## 2022-02-12 ENCOUNTER — Other Ambulatory Visit: Payer: Self-pay | Admitting: Internal Medicine

## 2022-03-04 ENCOUNTER — Other Ambulatory Visit: Payer: Self-pay | Admitting: Internal Medicine

## 2022-05-06 ENCOUNTER — Telehealth: Payer: Self-pay | Admitting: Internal Medicine

## 2022-05-06 MED ORDER — TIRZEPATIDE 15 MG/0.5ML ~~LOC~~ SOAJ: 15.0000 mg | SUBCUTANEOUS | 0 refills | Status: DC

## 2022-05-06 NOTE — Telephone Encounter (Signed)
Patient called and said her tirzepatide [facility]) 12.5 MG/0.5ML Pen dosage was not working for her. She would like to see if her dosage can be increased to 15 mg.  Pharmacy is [id number]- [place], [place] [phone]

## 2022-05-06 NOTE — Telephone Encounter (Signed)
Spoke with patient today.

## 2022-05-06 NOTE — Telephone Encounter (Signed)
Higher dose sent to pharmacy.

## 2022-05-19 ENCOUNTER — Other Ambulatory Visit: Payer: Self-pay | Admitting: Internal Medicine

## 2022-05-22 ENCOUNTER — Other Ambulatory Visit: Payer: Self-pay

## 2022-05-22 MED ORDER — MOUNJARO 12.5 MG/0.5ML ~~LOC~~ SOAJ: SUBCUTANEOUS | 0 refills | Status: DC

## 2022-05-22 MED ORDER — MOUNJARO 15 MG/0.5ML ~~LOC~~ SOAJ: SUBCUTANEOUS | 0 refills | Status: DC

## 2022-05-22 NOTE — Addendum Note (Signed)
Addended by: Earnstine Regal on: 05/22/2022 10:37 AM   Modules accepted: Orders

## 2022-05-25 ENCOUNTER — Other Ambulatory Visit: Payer: Self-pay | Admitting: Internal Medicine

## 2022-05-26 NOTE — Telephone Encounter (Signed)
Rec msg from pharmacy Product [place].

## 2022-05-26 NOTE — Telephone Encounter (Signed)
Sent lower dose

## 2022-05-31 ENCOUNTER — Other Ambulatory Visit: Payer: Self-pay | Admitting: Internal Medicine

## 2022-06-02 ENCOUNTER — Other Ambulatory Visit: Payer: Self-pay

## 2022-06-17 ENCOUNTER — Ambulatory Visit: Payer: No Typology Code available for payment source | Admitting: Internal Medicine

## 2022-06-19 ENCOUNTER — Ambulatory Visit: Payer: No Typology Code available for payment source | Admitting: Internal Medicine

## 2022-06-19 DIAGNOSIS — E7849 Other hyperlipidemia: Secondary | ICD-10-CM

## 2022-06-19 DIAGNOSIS — E1169 Type 2 diabetes mellitus with other specified complication: Secondary | ICD-10-CM

## 2022-06-19 DIAGNOSIS — F3289 Other specified depressive episodes: Secondary | ICD-10-CM

## 2022-06-19 DIAGNOSIS — I10 Essential (primary) hypertension: Secondary | ICD-10-CM

## 2022-06-19 DIAGNOSIS — E559 Vitamin D deficiency, unspecified: Secondary | ICD-10-CM

## 2022-06-19 DIAGNOSIS — F419 Anxiety disorder, unspecified: Secondary | ICD-10-CM

## 2022-06-19 DIAGNOSIS — R7989 Other specified abnormal findings of blood chemistry: Secondary | ICD-10-CM

## 2022-06-19 DIAGNOSIS — Z0000 Encounter for general adult medical examination without abnormal findings: Secondary | ICD-10-CM

## 2022-06-26 ENCOUNTER — Encounter: Payer: Self-pay | Admitting: Internal Medicine

## 2022-06-26 NOTE — Patient Instructions (Addendum)
      Blood work was ordered.   The lab is on the first floor.    Medications changes include :   none    Return in about 6 months (around 12/28/2022) for Physical Exam.

## 2022-06-27 ENCOUNTER — Ambulatory Visit: Payer: No Typology Code available for payment source | Admitting: Internal Medicine

## 2022-06-27 VITALS — BP 120/80 | HR 67 | Temp 98.0°F | Ht 70.0 in | Wt 265.0 lb

## 2022-06-27 DIAGNOSIS — E1169 Type 2 diabetes mellitus with other specified complication: Secondary | ICD-10-CM

## 2022-06-27 DIAGNOSIS — D649 Anemia, unspecified: Secondary | ICD-10-CM

## 2022-06-27 DIAGNOSIS — E7849 Other hyperlipidemia: Secondary | ICD-10-CM | POA: Diagnosis not present

## 2022-06-27 DIAGNOSIS — I10 Essential (primary) hypertension: Secondary | ICD-10-CM | POA: Diagnosis not present

## 2022-06-27 DIAGNOSIS — Z0000 Encounter for general adult medical examination without abnormal findings: Secondary | ICD-10-CM

## 2022-06-27 DIAGNOSIS — K219 Gastro-esophageal reflux disease without esophagitis: Secondary | ICD-10-CM

## 2022-06-27 DIAGNOSIS — F3289 Other specified depressive episodes: Secondary | ICD-10-CM

## 2022-06-27 DIAGNOSIS — F419 Anxiety disorder, unspecified: Secondary | ICD-10-CM

## 2022-06-27 DIAGNOSIS — R7989 Other specified abnormal findings of blood chemistry: Secondary | ICD-10-CM | POA: Diagnosis not present

## 2022-06-27 LAB — IBC PANEL
Iron: 69 ug/dL (ref 42–145)
Saturation Ratios: 21.2 % (ref 20.0–50.0)
TIBC: 326.2 ug/dL (ref 250.0–450.0)
Transferrin: 233 mg/dL (ref 212.0–360.0)

## 2022-06-27 LAB — COMPREHENSIVE METABOLIC PANEL
ALT: 26 U/L (ref 0–35)
AST: 26 U/L (ref 0–37)
Albumin: 4.4 g/dL (ref 3.5–5.2)
Alkaline Phosphatase: 111 U/L (ref 39–117)
BUN: 10 mg/dL (ref 6–23)
CO2: 28 mEq/L (ref 19–32)
Calcium: 10 mg/dL (ref 8.4–10.5)
Chloride: 103 mEq/L (ref 96–112)
Creatinine, Ser: 1.03 mg/dL (ref 0.40–1.20)
GFR: 65.34 mL/min (ref >60.00)
Glucose, Bld: 84 mg/dL (ref 70–99)
Potassium: 4.3 mEq/L (ref 3.5–5.1)
Sodium: 139 mEq/L (ref 135–145)
Total Bilirubin: 0.6 mg/dL (ref 0.2–1.2)
Total Protein: 8.2 g/dL (ref 6.0–8.3)

## 2022-06-27 LAB — LIPID PANEL
Cholesterol: 150 mg/dL (ref 0–200)
HDL: 49.6 mg/dL (ref >39.00)
LDL Cholesterol: 90 mg/dL (ref 0–99)
NonHDL: 100.24
Total CHOL/HDL Ratio: 3
Triglycerides: 50 mg/dL (ref 0.0–149.0)
VLDL: 10 mg/dL (ref 0.0–40.0)

## 2022-06-27 LAB — HEMOGLOBIN A1C: Hgb A1c MFr Bld: 5.6 % (ref 4.6–6.5)

## 2022-06-27 LAB — CBC WITH DIFFERENTIAL/PLATELET
Basophils Absolute: 0 10*3/uL (ref 0.0–0.1)
Basophils Relative: 0.4 % (ref 0.0–3.0)
Eosinophils Absolute: 0 10*3/uL (ref 0.0–0.7)
Eosinophils Relative: 0.5 % (ref 0.0–5.0)
HCT: 37.4 % (ref 36.0–46.0)
Hemoglobin: 12.6 g/dL (ref 12.0–15.0)
Lymphocytes Relative: 21.4 % (ref 12.0–46.0)
Lymphs Abs: 1.7 10*3/uL (ref 0.7–4.0)
MCHC: 33.7 g/dL (ref 30.0–36.0)
MCV: 90.1 fl (ref 78.0–100.0)
Monocytes Absolute: 0.4 10*3/uL (ref 0.1–1.0)
Monocytes Relative: 5.1 % (ref 3.0–12.0)
Neutro Abs: 5.7 10*3/uL (ref 1.4–7.7)
Neutrophils Relative %: 72.6 % (ref 43.0–77.0)
Platelets: 353 10*3/uL (ref 150.0–400.0)
RBC: 4.16 Mil/uL (ref 3.87–5.11)
RDW: 14.3 % (ref 11.5–15.5)
WBC: 7.9 10*3/uL (ref 4.0–10.5)

## 2022-06-27 LAB — FERRITIN: Ferritin: 35 ng/mL (ref 10.0–291.0)

## 2022-06-27 MED ORDER — MOUNJARO 15 MG/0.5ML ~~LOC~~ SOAJ: SUBCUTANEOUS | 1 refills | Status: DC

## 2022-06-27 NOTE — Assessment & Plan Note (Addendum)
Chronic GERD controlled Continue Nexium 20 mg daily Will try to taper slowly

## 2022-06-27 NOTE — Assessment & Plan Note (Signed)
Chronic   Lab Results  Component Value Date   HGBA1C 5.9 12/17/2021   Sugars well controlled Testing sugars 1 times a day Check A1c Continue Mounjaro 15 mg weekly, metformin 500 mg twice daily Stressed regular exercise, diabetic diet

## 2022-06-27 NOTE — Progress Notes (Signed)
Subjective:    Patient ID: Debra Le, female    DOB: 1976-03-22, 47 y.o.   MRN: [id number]     HPI Debra Le is here for follow up of her chronic medical problems.  She started running.  She is dong running/walking. She is doing pilates.  Overall doing well and has no concerns.  Medications and allergies reviewed with patient and updated if appropriate.  Current Outpatient Medications on File Prior to Visit  Medication Sig Dispense Refill   amLODipine (NORVASC) 10 MG tablet TAKE 1 TABLET BY MOUTH EVERY DAY 90 tablet 1   atorvastatin (LIPITOR) 20 MG tablet TAKE 1 TABLET BY MOUTH EVERY DAY 90 tablet 1   blood glucose meter kit and supplies KIT Dispense based on patient and insurance preference. Use up to four times daily as directed. 1 each 0   buPROPion ER (WELLBUTRIN SR) 100 MG 12 hr tablet TAKE 1 TABLET BY MOUTH TWICE A DAY 180 tablet 1   escitalopram (LEXAPRO) 10 MG tablet TAKE 1 & 1/2 TABLETS BY MOUTH DAILY 135 tablet 1   esomeprazole (NEXIUM) 40 MG capsule TAKE 1 CAPSULE(40 MG) BY MOUTH DAILY 90 capsule 1   metFORMIN (GLUCOPHAGE) 500 MG tablet TAKE 1 TABLET(500 MG) BY MOUTH TWICE DAILY WITH A MEAL 180 tablet 1   metoprolol tartrate (LOPRESSOR) 50 MG tablet TAKE 1 TABLET BY MOUTH TWICE A DAY 180 tablet 1   tirzepatide (MOUNJARO) 12.5 MG/0.5ML Pen INJECT 12.5 MG SUBCUTANEOUSLY ONE TIME PER WEEK 6 mL 0   tirzepatide (MOUNJARO) 15 MG/0.5ML Pen INJECT 15 MG INTO THE SKIN ONCE A WEEK. 6 mL 0   No current facility-administered medications on file prior to visit.     Review of Systems  Constitutional:  Negative for fever.  Respiratory:  Negative for cough, shortness of breath and wheezing.   Cardiovascular:  Negative for chest pain, palpitations and leg swelling.  Neurological:  Negative for light-headedness and headaches.       Objective:   Vitals:   06/27/22 0947 06/27/22 0955  BP: 120/80 (!) 146/74  Pulse: 67   Temp: 98 F (36.7 C)   SpO2: 99%    BP Readings  from Last 3 Encounters:  06/27/22 (!) 146/74  12/17/21 122/84  06/04/21 134/80   Wt Readings from Last 3 Encounters:  06/27/22 265 lb (120.2 kg)  12/17/21 271 lb (122.9 kg)  06/04/21 (!) 308 lb (139.7 kg)   Body mass index is 38.02 kg/m.    Physical Exam Constitutional:      General: She is not in acute distress.    Appearance: Normal appearance.  HENT:     Head: Normocephalic and atraumatic.  Eyes:     Conjunctiva/sclera: Conjunctivae normal.  Cardiovascular:     Rate and Rhythm: Normal rate and regular rhythm.     Heart sounds: Normal heart sounds.  Pulmonary:     Effort: Pulmonary effort is normal. No respiratory distress.     Breath sounds: Normal breath sounds. No wheezing.  Musculoskeletal:     Cervical back: Neck supple.     Right lower leg: No edema.     Left lower leg: No edema.  Lymphadenopathy:     Cervical: No cervical adenopathy.  Skin:    General: Skin is warm and dry.     Findings: No rash.  Neurological:     Mental Status: She is alert. Mental status is at baseline.  Psychiatric:        Mood and  Affect: Mood normal.        Behavior: Behavior normal.        Lab Results  Component Value Date   WBC 7.4 12/17/2021   HGB 11.7 (L) 12/17/2021   HCT 35.6 (L) 12/17/2021   PLT 351.0 12/17/2021   GLUCOSE 73 12/17/2021   CHOL 111 12/17/2021   TRIG 45.0 12/17/2021   HDL 33.90 (L) 12/17/2021   LDLCALC 68 12/17/2021   ALT 47 (H) 12/17/2021   AST 132 (H) 12/17/2021   NA 136 12/17/2021   K 4.1 12/17/2021   CL 101 12/17/2021   CREATININE 0.94 12/17/2021   BUN 12 12/17/2021   CO2 27 12/17/2021   TSH 1.09 12/17/2021   HGBA1C 5.9 12/17/2021   MICROALBUR 1.4 12/17/2021     Assessment & Plan:    See Problem List for Assessment and Plan of chronic medical problems.

## 2022-06-27 NOTE — Assessment & Plan Note (Addendum)
Chronic Blood pressure well controlled CMP Continue metoprolol 50 mg twice daily, amlodipine 10 mg daily Blood pressure well-controlled and she can try to decrease her medications slightly-decrease metoprolol to at half dose and will monitor BP at home

## 2022-06-27 NOTE — Assessment & Plan Note (Signed)
Chronic °Regular exercise and healthy diet encouraged °Check lipid panel  °Continue atorvastatin 20 mg daily °

## 2022-06-27 NOTE — Assessment & Plan Note (Addendum)
Chronic Controlled, Stable Continue Wellbutrin 100 mg twice daily and Lexapro 15 mg daily May try to discontinue Wellbutrin

## 2022-06-27 NOTE — Assessment & Plan Note (Signed)
History of iron deficiency Was initially related to heavy menses, endometriosis, but has had a hysterectomy Still with mild anemia Check iron panel, CBC

## 2022-06-29 ENCOUNTER — Encounter: Payer: Self-pay | Admitting: Internal Medicine

## 2022-07-07 ENCOUNTER — Other Ambulatory Visit: Payer: Self-pay | Admitting: Internal Medicine

## 2022-07-25 ENCOUNTER — Other Ambulatory Visit: Payer: Self-pay | Admitting: *Deleted

## 2022-07-25 DIAGNOSIS — R1909 Other intra-abdominal and pelvic swelling, mass and lump: Secondary | ICD-10-CM

## 2022-07-29 ENCOUNTER — Ambulatory Visit: Payer: No Typology Code available for payment source | Admitting: General Surgery

## 2022-07-29 ENCOUNTER — Encounter: Payer: Self-pay | Admitting: General Surgery

## 2022-07-29 VITALS — BP 111/75 | HR 63 | Temp 97.8°F | Resp 12 | Ht 70.0 in | Wt 261.0 lb

## 2022-07-29 DIAGNOSIS — N809 Endometriosis, unspecified: Secondary | ICD-10-CM | POA: Diagnosis not present

## 2022-07-29 DIAGNOSIS — R1909 Other intra-abdominal and pelvic swelling, mass and lump: Secondary | ICD-10-CM

## 2022-07-29 NOTE — Patient Instructions (Signed)
Will get an MRI to look at the area.  Will call with results and plan.

## 2022-07-29 NOTE — Progress Notes (Unsigned)
[facility] History and Physical  Reason for Referral:*** Referring Physician: ***  Chief Complaint   New Patient (Initial Visit)     Debra Le is a 47 y.o. female.  HPI: ***.  The *** started *** and has had a duration of ***.  It is associated with ***.  The *** is improved with ***, and is made worse with ***.    Quality*** Context***  Past Medical History:  Diagnosis Date   Abnormal uterine bleeding (AUB)    Anxiety    COVID 04/2019   cough x 7 days all symptoms reoslved   Depression    DM type 2 (diabetes mellitus, type 2)    GERD (gastroesophageal reflux disease)    Heart murmur    mild no cardiologist   Hypertension    Obesity    Palpitation    occ no cardiologist   Wears glasses    for reading    Past Surgical History:  Procedure Laterality Date   CESAREAN SECTION  02/21/2005   with Tubal ligation   CYSTOSCOPY N/A 09/05/2020   Procedure: CYSTOSCOPY;  Surgeon: Debra Hamman, MD;  Location: [facility];  Service: Gynecology;  Laterality: N/A;  possible   TOTAL LAPAROSCOPIC HYSTERECTOMY WITH SALPINGECTOMY Bilateral 09/05/2020   Procedure: TOTAL LAPAROSCOPIC HYSTERECTOMY WITH SALPINGECTOMY; REMOVAL ABDOMINAL WALL NODULE;  Surgeon: Debra Hamman, MD;  Location: [facility] [place];  Service: Gynecology;  Laterality: Bilateral;    Family History  Problem Relation Age of Onset   Diabetes Mother    Diabetes Maternal Aunt        all maternal side   Diabetes Sister        x 2   Hypertension Maternal Aunt        all maternal side   Lymphoma Father    Heart attack Maternal Aunt    Dementia Maternal Aunt     Social History   Tobacco Use   Smoking status: Never   Smokeless tobacco: Never  Vaping Use   Vaping Use: Never used  Substance Use Topics   Alcohol use: Yes    Alcohol/week: 7.0 standard drinks of alcohol    Types: 7 Standard drinks or equivalent per week    Comment: occ wine   Drug use: No     Medications: {medication reviewed/display:3041432} Allergies as of 07/29/2022   No Known Allergies      Medication List        Accurate as of July 29, 2022  9:57 AM. If you have any questions, ask your nurse or doctor.          amLODipine 10 MG tablet Commonly known as: NORVASC TAKE 1 TABLET BY MOUTH EVERY DAY   atorvastatin 20 MG tablet Commonly known as: LIPITOR TAKE 1 TABLET BY MOUTH EVERY DAY   blood glucose meter kit and supplies Kit Dispense based on patient and insurance preference. Use up to four times daily as directed.   buPROPion ER 100 MG 12 hr tablet Commonly known as: WELLBUTRIN SR TAKE 1 TABLET BY MOUTH TWICE A DAY   escitalopram 10 MG tablet Commonly known as: LEXAPRO TAKE 1 AND 1/2 TABLETS DAILY BY MOUTH   esomeprazole 20 MG capsule Commonly known as: NEXIUM Take 20 mg by mouth daily at 12 noon.   metFORMIN 500 MG tablet Commonly known as: GLUCOPHAGE TAKE 1 TABLET(500 MG) BY MOUTH TWICE DAILY WITH A MEAL   metoprolol tartrate 50 MG tablet Commonly known as: LOPRESSOR TAKE 1 TABLET  BY MOUTH TWICE A DAY   Mounjaro 15 MG/0.5ML Pen Generic drug: tirzepatide INJECT 15 MG INTO THE SKIN ONCE A WEEK.   Mounjaro 10 MG/0.5ML Pen Generic drug: tirzepatide INJECT 10 MG INTO THE SKIN ONCE A WEEK         ROS:  {Review of Systems:30496}  Blood pressure 111/75, pulse 63, temperature 97.8 F (36.6 C), temperature source Oral, resp. rate 12, height  (1.778 m), weight 261 lb (118.4 kg), last menstrual period 08/29/2020, SpO2 99 %. Physical Exam  Results: No results found for this or any previous visit (from the past 48 hour(s)).  No results found.   Assessment & Plan:  Debra Le is a 47 y.o. female with *** -*** -*** -Follow up ***  Future Appointments  Date Time Provider Department Center  08/07/2022  9:00 AM [facility]  12/26/2022 10:20 AM Burns, Bobette Mo, MD [facility] None    All questions were answered to the  satisfaction of the patient and family***.  The risk and benefits of *** were discussed including but not limited to ***.  After careful consideration, Debra Le has decided to ***.    Debra Le 07/29/2022, 9:57 AM

## 2022-08-07 ENCOUNTER — Ambulatory Visit (HOSPITAL_BASED_OUTPATIENT_CLINIC_OR_DEPARTMENT_OTHER)
Admission: RE | Admit: 2022-08-07 | Discharge: 2022-08-07 | Disposition: A | Discharge status: Home / Self Care | Payer: No Typology Code available for payment source | Source: Ambulatory Visit | Attending: General Surgery | Admitting: General Surgery

## 2022-08-07 DIAGNOSIS — N809 Endometriosis, unspecified: Secondary | ICD-10-CM | POA: Diagnosis present

## 2022-08-07 MED ORDER — GADOBUTROL 1 MMOL/ML IV SOLN
10.0000 mL | Freq: Once | INTRAVENOUS | Status: AC | PRN
  Administered 2022-08-07: 10 mL via INTRAVENOUS
  Filled 2022-08-07: qty 10

## 2022-08-11 NOTE — Progress Notes (Signed)
Tried to call patient about MRI. VM left. Will try to touch base with her tomorrow if she does not call me back today.

## 2022-08-12 ENCOUNTER — Encounter: Payer: Self-pay | Admitting: *Deleted

## 2022-08-12 NOTE — Progress Notes (Signed)
Have reviewed imaging with Dr. Despina Hidden. He agrees that looks like it extends down to the fascia. Discussed with patient and discussed excision and possible hernia repair if fascia is taken and possible need for mesh. Discussed risk of bleeding, infection, recurrence and having Dr. Despina Hidden there to help so that we get this removed with good margins and have two eyes. Patient and husband good with this plan

## 2022-08-18 DIAGNOSIS — R1909 Other intra-abdominal and pelvic swelling, mass and lump: Secondary | ICD-10-CM | POA: Insufficient documentation

## 2022-08-18 NOTE — H&P (Signed)
[facility] History and Physical   Reason for Referral: Endometriosis implant  Referring Physician:  Dr. Jackelyn Knife   Chief Complaint   New Patient (Initial Visit)        Debra Le is a 47 y.o. female.  HPI: Debra Le is a sweet 47 yo who comes in with a history of endometriosis and prior hysterectomy and endometrial implant excisions by Dr. Jackelyn Knife 09/2020. She reports that she has had a nodule that came up in her midline incision and that this bleeds and gets larger at times. She still has her ovaries.  She was evacuated by Dr. Jackelyn Knife who agrees this is another implant and that this needs to be excised. She has not had further imaging to see how deep this goes or if it is in the fascia. She was seen by Dr. Daphine Deutscher CCS and he offered excision. She wanted a second opinion.  She has pain and bleeding at this site and this was locally infected it sounds like a few weeks ago, requiring some oral antibiotics.        Past Medical History:  Diagnosis Date   Abnormal uterine bleeding (AUB)     Anxiety     COVID 04/2019    cough x 7 days all symptoms reoslved   Depression     DM type 2 (diabetes mellitus, type 2)     GERD (gastroesophageal reflux disease)     Heart murmur      mild no cardiologist   Hypertension     Obesity     Palpitation      occ no cardiologist   Wears glasses      for reading           Past Surgical History:  Procedure Laterality Date   CESAREAN SECTION   02/21/2005    with Tubal ligation   CYSTOSCOPY N/A 09/05/2020    Procedure: CYSTOSCOPY;  Surgeon: Lavina Hamman, MD;  Location: [facility];  Service: Gynecology;  Laterality: N/A;  possible   TOTAL LAPAROSCOPIC HYSTERECTOMY WITH SALPINGECTOMY Bilateral 09/05/2020    Procedure: TOTAL LAPAROSCOPIC HYSTERECTOMY WITH SALPINGECTOMY; REMOVAL ABDOMINAL WALL NODULE;  Surgeon: Lavina Hamman, MD;  Location: [facility] ;  Service: Gynecology;  Laterality:  Bilateral;           Family History  Problem Relation Age of Onset   Diabetes Mother     Diabetes Maternal Aunt          all maternal side   Diabetes Sister          x 2   Hypertension Maternal Aunt          all maternal side   Lymphoma Father     Heart attack Maternal Aunt     Dementia Maternal Aunt        Social History         Tobacco Use   Smoking status: Never   Smokeless tobacco: Never  Vaping Use   Vaping Use: Never used  Substance Use Topics   Alcohol use: Yes      Alcohol/week: 7.0 standard drinks of alcohol      Types: 7 Standard drinks or equivalent per week      Comment: occ wine   Drug use: No      Medications: I have reviewed the patient's current medications. Allergies as of 07/29/2022   No Known Allergies         Medication List  Accurate as of July 29, 2022  9:57 AM. If you have any questions, ask your nurse or doctor.              amLODipine 10 MG tablet Commonly known as: NORVASC TAKE 1 TABLET BY MOUTH EVERY DAY    atorvastatin 20 MG tablet Commonly known as: LIPITOR TAKE 1 TABLET BY MOUTH EVERY DAY    blood glucose meter kit and supplies Kit Dispense based on patient and insurance preference. Use up to four times daily as directed.    buPROPion ER 100 MG 12 hr tablet Commonly known as: WELLBUTRIN SR TAKE 1 TABLET BY MOUTH TWICE A DAY    escitalopram 10 MG tablet Commonly known as: LEXAPRO TAKE 1 AND 1/2 TABLETS DAILY BY MOUTH    esomeprazole 20 MG capsule Commonly known as: NEXIUM Take 20 mg by mouth daily at 12 noon.    metFORMIN 500 MG tablet Commonly known as: GLUCOPHAGE TAKE 1 TABLET(500 MG) BY MOUTH TWICE DAILY WITH A MEAL    metoprolol tartrate 50 MG tablet Commonly known as: LOPRESSOR TAKE 1 TABLET BY MOUTH TWICE A DAY    Mounjaro 15 MG/0.5ML Pen Generic drug: tirzepatide INJECT 15 MG INTO THE SKIN ONCE A WEEK.    Mounjaro 10 MG/0.5ML Pen Generic drug: tirzepatide INJECT 10 MG INTO THE SKIN ONCE  A WEEK               ROS:  A comprehensive review of systems was negative except for: Cardiovascular: positive for HTN Musculoskeletal: positive for pain and bleeding at pfannenstiel incision nodule Endocrine: positive for diabetes   Blood pressure 111/75, pulse 63, temperature 97.8 F (36.6 C), temperature source Oral, resp. rate 12, height 5\' 10"  (1.778 m), weight 261 lb (118.4 kg), last menstrual period 08/29/2020, SpO2 99 %. Physical Exam Vitals reviewed.  HENT:     Head: Normocephalic.     Nose: Nose normal.     Mouth/Throat:     Mouth: Mucous membranes are moist.  Eyes:     Extraocular Movements: Extraocular movements intact.  Cardiovascular:     Rate and Rhythm: Normal rate and regular rhythm.  Pulmonary:     Effort: Pulmonary effort is normal.     Breath sounds: Normal breath sounds.  Abdominal:     General: There is no distension.     Palpations: Abdomen is soft.     Tenderness: There is abdominal tenderness.     Hernia: No hernia is present.     Comments: Pfannenstiel incision with mid incision nodule about 3-4cm, tender around, some hypertrophic scarring and moisture in the area, right at the hairline of the pubis/ under pannus fold  Musculoskeletal:        General: No swelling.     Cervical back: Normal range of motion.  Skin:    General: Skin is warm.  Neurological:     General: No focal deficit present.     Mental Status: She is alert.  Psychiatric:        Mood and Affect: Mood normal.        Behavior: Behavior normal.        Results: None    Assessment & Plan:  Debra Le is a 47 y.o. female with an endometrial implant s/p attempt at prior excision with her hysterectomy in 2022. This has returned and she is having bleeding, pain and issues with moisture and infection. She needs this excised.    I think given the location and risk  of not getting the entire area she needs an MRI to see how far things are extending.  MRI ordered of the pelvis to  look at the mass  Discussed procedure of excision, risk of bleeding, infection, recurrence, and issues with healing due to the location of the mass and incision at the hairline/ and under pannus fold.            Future Appointments  Date Time Provider Department Center  08/07/2022  9:00 AM [facility]  12/26/2022 10:20 AM Burns, Bobette Mo, MD [facility] None      All questions were answered to the satisfaction of the patient and family.   Addendum- MRI with endometrial like mass in the pfannenstiel incision, extends to fascia, discussed with Dr. Despina Hidden who will help excise the area fully and I will repair the defect    Lucretia Roers 07/29/2022, 9:57 AM

## 2022-08-21 NOTE — Patient Instructions (Signed)
Debra Le  08/21/2022     @PREFPERIOPPHARMACY @   Your procedure is scheduled on 08/27/2022.   Report to [facility] at  0830  A.M.   Call this number if you have problems the morning of surgery:  [phone]  If you experience any cold or flu symptoms such as cough, fever, chills, shortness of breath, etc. between now and your scheduled surgery, please notify us at the above number.   Remember:  Do not eat or drink after midnight.          Your last dose of mounjaro should have been on 08/19/2022.       DO NOT take any medications for diabetes the morning of your procedure.   Take these medicines the morning of surgery with A SIP OF WATER       amlodipine, wellbutrin, zyrtec, lexapro, metoprolol.     Do not wear jewelry, make-up or nail polish.  Do not wear lotions, powders, or perfumes, or deodorant.  Do not shave 48 hours prior to surgery.  Men may shave face and neck.  Do not bring valuables to the hospital.  [facility] is not responsible for any belongings or valuables.  Contacts, dentures or bridgework may not be worn into surgery.  Leave your suitcase in the car.  After surgery it may be brought to your room.  For patients admitted to the hospital, discharge time will be determined by your treatment team.  Patients discharged the day of surgery will not be allowed to drive home and must have someone with them for 24 hours.    Special instructions:   DO NOT smoke tobacco or vape for 24 hours before your procedure.  Please read over the following fact sheets that you were given. Anesthesia Post-op Instructions and Care and Recovery After Surgery      Open Hernia Repair, Adult, Care After What can I expect after the procedure? After the procedure, it is common to have: Mild discomfort. Slight bruising. Mild swelling. Pain in the belly (abdomen). A small amount of blood from the cut from surgery (incision). Follow these instructions at  home: Your doctor may give you more specific instructions. If you have problems, call your doctor. Medicines Take over-the-counter and prescription medicines only as told by your doctor. If told, take steps to prevent problems with pooping (constipation). You may need to: Drink enough fluid to keep your pee (urine) pale yellow. Take medicines. You will be told what medicines to take. Eat foods that are high in fiber. These include beans, whole grains, and fresh fruits and vegetables. Limit foods that are high in fat and sugar. These include fried or sweet foods. Ask your doctor if you should avoid driving or using machines while you are taking your medicine. Incision care  Follow instructions from your doctor about how to take care of your incision. Make sure you: Wash your hands with soap and water for at least 20 seconds before and after you change your bandage (dressing). If you cannot use soap and water, use hand sanitizer. Change your bandage. Leave stitches or skin glue in place for at least 2 weeks. Leave tape strips alone unless you are told to take them off. You may trim the edges of the tape strips if they curl up. Check your incision every day for signs of infection. Check for: More redness, swelling, or pain. More fluid or blood. Warmth. Pus or a bad smell. Wear loose,  soft clothing while your incision heals. Activity  Rest as told by your doctor. Do not lift anything that is heavier than 10 lb (4.5 kg), or the limit that you are told. Do not play contact sports until your doctor says that this is safe. If you were given a sedative during your procedure, do not drive or use machines until your doctor says that it is safe. A sedative is a medicine that helps you relax. Return to your normal activities when your doctor says that it is safe. General instructions Do not take baths, swim, or use a hot tub. Ask your doctor about taking showers or sponge baths. Hold a pillow over  your belly when you cough or sneeze. This helps with pain. Do not smoke or use any products that contain nicotine or tobacco. If you need help quitting, ask your doctor. Keep all follow-up visits. Contact a doctor if: You have any of these signs of infection in or around your incision: More redness, swelling, or pain. More fluid or blood. Warmth. Pus. A bad smell. You have a fever or chills. You have blood in your poop (stool). You have not pooped (had a bowel movement) in 2-3 days. Medicine does not help your pain. Get help right away if: You have chest pain, or you are short of breath. You feel faint or light-headed. You have very bad pain. You vomit and your pain is worse. You have pain, swelling, or redness in a leg. These symptoms may be an emergency. Get help right away. Call your local emergency services (911 in the [place] Do not wait to see if the symptoms will go away. Do not drive yourself to the hospital. Summary After this procedure, it is common to have mild discomfort, slight bruising, and mild swelling. Follow instructions from your doctor about how to take care of your cut from surgery (incision). Check every day for signs of infection. Do not lift heavy objects or play contact sports until your doctor says it is safe. Return to your normal activities as told by your doctor. This information is not intended to replace advice given to you by your health care provider. Make sure you discuss any questions you have with your health care provider. Document Revised: 11/07/2019 Document Reviewed: 11/07/2019 Elsevier Patient Education  2023 Else[employer] Inc. General Anesthesia, Adult, Care After The following information offers guidance on how to care for yourself after your procedure. Your health care provider may also give you more specific instructions. If you have problems or questions, contact your health care provider. What can I expect after the procedure? After the  procedure, it is common for people to: Have pain or discomfort at the IV site. Have nausea or vomiting. Have a sore throat or hoarseness. Have trouble concentrating. Feel cold or chills. Feel weak, sleepy, or tired (fatigue). Have soreness and body aches. These can affect parts of the body that were not involved in surgery. Follow these instructions at home: For the time period you were told by your health care provider:  Rest. Do not participate in activities where you could fall or become injured. Do not drive or use machinery. Do not drink alcohol. Do not take sleeping pills or medicines that cause drowsiness. Do not make important decisions or sign legal documents. Do not take care of children on your own. General instructions Drink enough fluid to keep your urine pale yellow. If you have sleep apnea, surgery and certain medicines can increase your risk for breathing  problems. Follow instructions from your health care provider about wearing your sleep device: Anytime you are sleeping, including during daytime naps. While taking prescription pain medicines, sleeping medicines, or medicines that make you drowsy. Return to your normal activities as told by your health care provider. Ask your health care provider what activities are safe for you. Take over-the-counter and prescription medicines only as told by your health care provider. Do not use any products that contain nicotine or tobacco. These products include cigarettes, chewing tobacco, and vaping devices, such as e-cigarettes. These can delay incision healing after surgery. If you need help quitting, ask your health care provider. Contact a health care provider if: You have nausea or vomiting that does not get better with medicine. You vomit every time you eat or drink. You have pain that does not get better with medicine. You cannot urinate or have bloody urine. You develop a skin rash. You have a fever. Get help right away  if: You have trouble breathing. You have chest pain. You vomit blood. These symptoms may be an emergency. Get help right away. Call 911. Do not wait to see if the symptoms will go away. Do not drive yourself to the hospital. Summary After the procedure, it is common to have a sore throat, hoarseness, nausea, vomiting, or to feel weak, sleepy, or fatigue. For the time period you were told by your health care provider, do not drive or use machinery. Get help right away if you have difficulty breathing, have chest pain, or vomit blood. These symptoms may be an emergency. This information is not intended to replace advice given to you by your health care provider. Make sure you discuss any questions you have with your health care provider. Document Revised: 06/21/2021 Document Reviewed: 06/21/2021 Elsevier Patient Education  2023 Elsevier Inc. How to Use Chlorhexidine Before Surgery Chlorhexidine gluconate (CHG) is a germ-killing (antiseptic) solution that is used to clean the skin. It can get rid of the bacteria that normally live on the skin and can keep them away for about 24 hours. To clean your skin with CHG, you may be given: A CHG solution to use in the shower or as part of a sponge bath. A prepackaged cloth that contains CHG. Cleaning your skin with CHG may help lower the risk for infection: While you are staying in the intensive care unit of the hospital. If you have a vascular access, such as a central line, to provide short-term or long-term access to your veins. If you have a catheter to drain urine from your bladder. If you are on a ventilator. A ventilator is a machine that helps you breathe by moving air in and out of your lungs. After surgery. What are the risks? Risks of using CHG include: A skin reaction. Hearing loss, if CHG gets in your ears and you have a perforated eardrum. Eye injury, if CHG gets in your eyes and is not rinsed out. The CHG product catching fire. Make  sure that you avoid smoking and flames after applying CHG to your skin. Do not use CHG: If you have a chlorhexidine allergy or have previously reacted to chlorhexidine. On babies younger than 57 months of age. How to use CHG solution Use CHG only as told by your health care provider, and follow the instructions on the label. Use the full amount of CHG as directed. Usually, this is one bottle. During a shower Follow these steps when using CHG solution during a shower (unless your health  care provider gives you different instructions): Start the shower. Use your normal soap and shampoo to wash your face and hair. Turn off the shower or move out of the shower stream. Pour the CHG onto a clean washcloth. Do not use any type of brush or rough-edged sponge. Starting at your neck, lather your body down to your toes. Make sure you follow these instructions: If you will be having surgery, pay special attention to the part of your body where you will be having surgery. Scrub this area for at least 1 minute. Do not use CHG on your head or face. If the solution gets into your ears or eyes, rinse them well with water. Avoid your genital area. Avoid any areas of skin that have broken skin, cuts, or scrapes. Scrub your back and under your arms. Make sure to wash skin folds. Let the lather sit on your skin for 1-2 minutes or as long as told by your health care provider. Thoroughly rinse your entire body in the shower. Make sure that all body creases and crevices are rinsed well. Dry off with a clean towel. Do not put any substances on your body afterward--such as powder, lotion, or perfume--unless you are told to do so by your health care provider. Only use lotions that are recommended by the manufacturer. Put on clean clothes or pajamas. If it is the night before your surgery, sleep in clean sheets.  During a sponge bath Follow these steps when using CHG solution during a sponge bath (unless your health  care provider gives you different instructions): Use your normal soap and shampoo to wash your face and hair. Pour the CHG onto a clean washcloth. Starting at your neck, lather your body down to your toes. Make sure you follow these instructions: If you will be having surgery, pay special attention to the part of your body where you will be having surgery. Scrub this area for at least 1 minute. Do not use CHG on your head or face. If the solution gets into your ears or eyes, rinse them well with water. Avoid your genital area. Avoid any areas of skin that have broken skin, cuts, or scrapes. Scrub your back and under your arms. Make sure to wash skin folds. Let the lather sit on your skin for 1-2 minutes or as long as told by your health care provider. Using a different clean, wet washcloth, thoroughly rinse your entire body. Make sure that all body creases and crevices are rinsed well. Dry off with a clean towel. Do not put any substances on your body afterward--such as powder, lotion, or perfume--unless you are told to do so by your health care provider. Only use lotions that are recommended by the manufacturer. Put on clean clothes or pajamas. If it is the night before your surgery, sleep in clean sheets. How to use CHG prepackaged cloths Only use CHG cloths as told by your health care provider, and follow the instructions on the label. Use the CHG cloth on clean, dry skin. Do not use the CHG cloth on your head or face unless your health care provider tells you to. When washing with the CHG cloth: Avoid your genital area. Avoid any areas of skin that have broken skin, cuts, or scrapes. Before surgery Follow these steps when using a CHG cloth to clean before surgery (unless your health care provider gives you different instructions): Using the CHG cloth, vigorously scrub the part of your body where you will be having surgery.  Scrub using a back-and-forth motion for 3 minutes. The area on your  body should be completely wet with CHG when you are done scrubbing. Do not rinse. Discard the cloth and let the area air-dry. Do not put any substances on the area afterward, such as powder, lotion, or perfume. Put on clean clothes or pajamas. If it is the night before your surgery, sleep in clean sheets.  For general bathing Follow these steps when using CHG cloths for general bathing (unless your health care provider gives you different instructions). Use a separate CHG cloth for each area of your body. Make sure you wash between any folds of skin and between your fingers and toes. Wash your body in the following order, switching to a new cloth after each step: The front of your neck, shoulders, and chest. Both of your arms, under your arms, and your hands. Your stomach and groin area, avoiding the genitals. Your right leg and foot. Your left leg and foot. The back of your neck, your back, and your buttocks. Do not rinse. Discard the cloth and let the area air-dry. Do not put any substances on your body afterward--such as powder, lotion, or perfume--unless you are told to do so by your health care provider. Only use lotions that are recommended by the manufacturer. Put on clean clothes or pajamas. Contact a health care provider if: Your skin gets irritated after scrubbing. You have questions about using your solution or cloth. You swallow any chlorhexidine. Call your local poison control center ((819)803-9486 in the [place] Get help right away if: Your eyes itch badly, or they become very red or swollen. Your skin itches badly and is red or swollen. Your hearing changes. You have trouble seeing. You have swelling or tingling in your mouth or throat. You have trouble breathing. These symptoms may represent a serious problem that is an emergency. Do not wait to see if the symptoms will go away. Get medical help right away. Call your local emergency services (911 in the [place] Do not drive  yourself to the hospital. Summary Chlorhexidine gluconate (CHG) is a germ-killing (antiseptic) solution that is used to clean the skin. Cleaning your skin with CHG may help to lower your risk for infection. You may be given CHG to use for bathing. It may be in a bottle or in a prepackaged cloth to use on your skin. Carefully follow your health care provider's instructions and the instructions on the product label. Do not use CHG if you have a chlorhexidine allergy. Contact your health care provider if your skin gets irritated after scrubbing. This information is not intended to replace advice given to you by your health care provider. Make sure you discuss any questions you have with your health care provider. Document Revised: 07/22/2021 Document Reviewed: 06/04/2020 Elsevier Patient Education  2023 [employer].

## 2022-08-24 ENCOUNTER — Other Ambulatory Visit: Payer: Self-pay | Admitting: Obstetrics & Gynecology

## 2022-08-24 DIAGNOSIS — Z01818 Encounter for other preprocedural examination: Secondary | ICD-10-CM

## 2022-08-25 ENCOUNTER — Encounter (HOSPITAL_COMMUNITY)
Admission: RE | Admit: 2022-08-25 | Discharge: 2022-08-25 | Disposition: A | Discharge status: Home / Self Care | Payer: No Typology Code available for payment source | Source: Ambulatory Visit | Attending: General Surgery | Admitting: General Surgery

## 2022-08-25 ENCOUNTER — Encounter (HOSPITAL_COMMUNITY): Payer: Self-pay

## 2022-08-25 VITALS — Ht 70.0 in | Wt 265.0 lb

## 2022-08-25 DIAGNOSIS — R001 Bradycardia, unspecified: Secondary | ICD-10-CM | POA: Diagnosis not present

## 2022-08-25 DIAGNOSIS — Z01818 Encounter for other preprocedural examination: Secondary | ICD-10-CM | POA: Insufficient documentation

## 2022-08-25 DIAGNOSIS — I10 Essential (primary) hypertension: Secondary | ICD-10-CM | POA: Diagnosis not present

## 2022-08-27 ENCOUNTER — Other Ambulatory Visit: Payer: Self-pay

## 2022-08-27 ENCOUNTER — Encounter (HOSPITAL_COMMUNITY): Payer: Self-pay | Admitting: General Surgery

## 2022-08-27 ENCOUNTER — Ambulatory Visit (HOSPITAL_COMMUNITY)
Admission: RE | Admit: 2022-08-27 | Discharge: 2022-08-27 | Disposition: A | Discharge status: Home / Self Care | Payer: No Typology Code available for payment source | Attending: General Surgery | Admitting: General Surgery

## 2022-08-27 ENCOUNTER — Ambulatory Visit (HOSPITAL_COMMUNITY): Payer: No Typology Code available for payment source | Admitting: Anesthesiology

## 2022-08-27 ENCOUNTER — Ambulatory Visit (HOSPITAL_BASED_OUTPATIENT_CLINIC_OR_DEPARTMENT_OTHER): Payer: No Typology Code available for payment source | Admitting: Anesthesiology

## 2022-08-27 ENCOUNTER — Encounter (HOSPITAL_COMMUNITY)
Admission: RE | Disposition: A | Discharge status: Home / Self Care | Payer: Self-pay | Source: Home / Self Care | Attending: General Surgery

## 2022-08-27 DIAGNOSIS — K219 Gastro-esophageal reflux disease without esophagitis: Secondary | ICD-10-CM | POA: Diagnosis not present

## 2022-08-27 DIAGNOSIS — F418 Other specified anxiety disorders: Secondary | ICD-10-CM | POA: Diagnosis not present

## 2022-08-27 DIAGNOSIS — E119 Type 2 diabetes mellitus without complications: Secondary | ICD-10-CM | POA: Insufficient documentation

## 2022-08-27 DIAGNOSIS — Z7984 Long term (current) use of oral hypoglycemic drugs: Secondary | ICD-10-CM | POA: Diagnosis not present

## 2022-08-27 DIAGNOSIS — Z833 Family history of diabetes mellitus: Secondary | ICD-10-CM | POA: Insufficient documentation

## 2022-08-27 DIAGNOSIS — Z7985 Long-term (current) use of injectable non-insulin antidiabetic drugs: Secondary | ICD-10-CM | POA: Diagnosis not present

## 2022-08-27 DIAGNOSIS — R1909 Other intra-abdominal and pelvic swelling, mass and lump: Secondary | ICD-10-CM | POA: Diagnosis not present

## 2022-08-27 DIAGNOSIS — Z79899 Other long term (current) drug therapy: Secondary | ICD-10-CM | POA: Diagnosis not present

## 2022-08-27 DIAGNOSIS — N80C10 Endometriosis of the anterior abdominal wall, subcutaneous tissue: Secondary | ICD-10-CM | POA: Diagnosis present

## 2022-08-27 DIAGNOSIS — I10 Essential (primary) hypertension: Secondary | ICD-10-CM | POA: Insufficient documentation

## 2022-08-27 DIAGNOSIS — N858 Other specified noninflammatory disorders of uterus: Secondary | ICD-10-CM | POA: Diagnosis not present

## 2022-08-27 DIAGNOSIS — Z8249 Family history of ischemic heart disease and other diseases of the circulatory system: Secondary | ICD-10-CM | POA: Insufficient documentation

## 2022-08-27 HISTORY — PX: EXCISION MASS ABDOMINAL: SHX6701

## 2022-08-27 LAB — GLUCOSE, CAPILLARY
Glucose-Capillary: 108 mg/dL — ABNORMAL HIGH (ref 70–99)
Glucose-Capillary: 133 mg/dL — ABNORMAL HIGH (ref 70–99)

## 2022-08-27 SURGERY — EXCISION, MASS, TORSO
Anesthesia: General | Site: Abdomen

## 2022-08-27 MED ORDER — PROPOFOL 10 MG/ML IV BOLUS
INTRAVENOUS | Status: DC | PRN
  Administered 2022-08-27: 240 mg via INTRAVENOUS

## 2022-08-27 MED ORDER — BUPIVACAINE-MELOXICAM ER 200-6 MG/7ML IJ SOLN
300.0000 mg | Freq: Once | INTRAMUSCULAR | Status: DC
  Filled 2022-08-27: qty 1

## 2022-08-27 MED ORDER — ONDANSETRON HCL 4 MG/2ML IJ SOLN
INTRAMUSCULAR | Status: AC
  Filled 2022-08-27: qty 2

## 2022-08-27 MED ORDER — ROCURONIUM BROMIDE 10 MG/ML (PF) SYRINGE
PREFILLED_SYRINGE | INTRAVENOUS | Status: AC
  Filled 2022-08-27: qty 10

## 2022-08-27 MED ORDER — LIDOCAINE HCL (CARDIAC) PF 100 MG/5ML IV SOSY
PREFILLED_SYRINGE | INTRAVENOUS | Status: DC | PRN
  Administered 2022-08-27: 80 mg via INTRATRACHEAL

## 2022-08-27 MED ORDER — KETOROLAC TROMETHAMINE 30 MG/ML IJ SOLN: 30.0000 mg | Freq: Once | INTRAMUSCULAR | Status: AC

## 2022-08-27 MED ORDER — PROPOFOL 10 MG/ML IV BOLUS
INTRAVENOUS | Status: AC
  Filled 2022-08-27: qty 20

## 2022-08-27 MED ORDER — CEFAZOLIN SODIUM-DEXTROSE 2-4 GM/100ML-% IV SOLN
2.0000 g | INTRAVENOUS | Status: AC
  Administered 2022-08-27: 2 g via INTRAVENOUS

## 2022-08-27 MED ORDER — ORAL CARE MOUTH RINSE: 15.0000 mL | Freq: Once | OROMUCOSAL | Status: AC

## 2022-08-27 MED ORDER — CHLORHEXIDINE GLUCONATE CLOTH 2 % EX PADS: 6.0000 | MEDICATED_PAD | Freq: Once | CUTANEOUS | Status: DC

## 2022-08-27 MED ORDER — BUPIVACAINE-MELOXICAM ER 200-6 MG/7ML IJ SOLN
INTRAMUSCULAR | Status: DC | PRN
  Administered 2022-08-27: 200 mg

## 2022-08-27 MED ORDER — CEFAZOLIN SODIUM-DEXTROSE 2-4 GM/100ML-% IV SOLN
INTRAVENOUS | Status: AC
  Filled 2022-08-27: qty 100

## 2022-08-27 MED ORDER — FENTANYL CITRATE (PF) 100 MCG/2ML IJ SOLN
INTRAMUSCULAR | Status: DC | PRN
  Administered 2022-08-27 (×2): 50 ug via INTRAVENOUS

## 2022-08-27 MED ORDER — MEPERIDINE HCL 50 MG/ML IJ SOLN: 6.2500 mg | INTRAMUSCULAR | Status: DC | PRN

## 2022-08-27 MED ORDER — DEXAMETHASONE SODIUM PHOSPHATE 10 MG/ML IJ SOLN
INTRAMUSCULAR | Status: AC
  Filled 2022-08-27: qty 1

## 2022-08-27 MED ORDER — DEXAMETHASONE SODIUM PHOSPHATE 10 MG/ML IJ SOLN
INTRAMUSCULAR | Status: DC | PRN
  Administered 2022-08-27: 6 mg via INTRAVENOUS

## 2022-08-27 MED ORDER — MIDAZOLAM HCL 2 MG/2ML IJ SOLN
INTRAMUSCULAR | Status: AC
  Filled 2022-08-27: qty 2

## 2022-08-27 MED ORDER — 0.9 % SODIUM CHLORIDE (POUR BTL) OPTIME
TOPICAL | Status: DC | PRN
  Administered 2022-08-27: 1000 mL

## 2022-08-27 MED ORDER — POVIDONE-IODINE 10 % EX SWAB
2.0000 | Freq: Once | CUTANEOUS | Status: AC
  Administered 2022-08-27: 2 via TOPICAL

## 2022-08-27 MED ORDER — MIDAZOLAM HCL 2 MG/2ML IJ SOLN
INTRAMUSCULAR | Status: DC | PRN
  Administered 2022-08-27: 2 mg via INTRAVENOUS

## 2022-08-27 MED ORDER — HYDROMORPHONE HCL 1 MG/ML IJ SOLN
0.2500 mg | INTRAMUSCULAR | Status: DC | PRN
  Administered 2022-08-27 (×3): 0.5 mg via INTRAVENOUS
  Filled 2022-08-27 (×3): qty 0.5

## 2022-08-27 MED ORDER — HYDROMORPHONE HCL 1 MG/ML IJ SOLN
INTRAMUSCULAR | Status: AC
  Filled 2022-08-27: qty 1

## 2022-08-27 MED ORDER — OXYCODONE HCL 5 MG PO TABS: 5.0000 mg | ORAL_TABLET | ORAL | 0 refills | Status: DC | PRN

## 2022-08-27 MED ORDER — HYDROMORPHONE HCL 1 MG/ML IJ SOLN
INTRAMUSCULAR | Status: DC | PRN
  Administered 2022-08-27 (×2): .5 mg via INTRAVENOUS

## 2022-08-27 MED ORDER — ONDANSETRON HCL 4 MG PO TABS: 4.0000 mg | ORAL_TABLET | Freq: Three times a day (TID) | ORAL | 1 refills | Status: AC | PRN

## 2022-08-27 MED ORDER — CHLORHEXIDINE GLUCONATE 0.12 % MT SOLN
15.0000 mL | Freq: Once | OROMUCOSAL | Status: AC
  Administered 2022-08-27: 15 mL via OROMUCOSAL

## 2022-08-27 MED ORDER — LIDOCAINE HCL (PF) 2 % IJ SOLN
INTRAMUSCULAR | Status: AC
  Filled 2022-08-27: qty 5

## 2022-08-27 MED ORDER — ONDANSETRON HCL 4 MG/2ML IJ SOLN
INTRAMUSCULAR | Status: DC | PRN
  Administered 2022-08-27: 4 mg via INTRAVENOUS

## 2022-08-27 MED ORDER — GLYCOPYRROLATE PF 0.2 MG/ML IJ SOSY
PREFILLED_SYRINGE | INTRAMUSCULAR | Status: AC
  Filled 2022-08-27: qty 1

## 2022-08-27 MED ORDER — ROCURONIUM BROMIDE 10 MG/ML (PF) SYRINGE
PREFILLED_SYRINGE | INTRAVENOUS | Status: DC | PRN
  Administered 2022-08-27: 60 mg via INTRAVENOUS

## 2022-08-27 MED ORDER — SUGAMMADEX SODIUM 200 MG/2ML IV SOLN
INTRAVENOUS | Status: DC | PRN
  Administered 2022-08-27: 250 mg via INTRAVENOUS

## 2022-08-27 MED ORDER — FENTANYL CITRATE (PF) 100 MCG/2ML IJ SOLN
INTRAMUSCULAR | Status: AC
  Filled 2022-08-27: qty 2

## 2022-08-27 MED ORDER — GLYCOPYRROLATE PF 0.2 MG/ML IJ SOSY
PREFILLED_SYRINGE | INTRAMUSCULAR | Status: DC | PRN
  Administered 2022-08-27: .1 mg via INTRAVENOUS

## 2022-08-27 MED ORDER — KETOROLAC TROMETHAMINE 30 MG/ML IJ SOLN
INTRAMUSCULAR | Status: AC
  Administered 2022-08-27: 30 mg via INTRAVENOUS
  Filled 2022-08-27: qty 1

## 2022-08-27 MED ORDER — BUPIVACAINE HCL (PF) 0.5 % IJ SOLN
INTRAMUSCULAR | Status: AC
  Filled 2022-08-27: qty 30

## 2022-08-27 MED ORDER — ONDANSETRON HCL 4 MG/2ML IJ SOLN: 4.0000 mg | Freq: Once | INTRAMUSCULAR | Status: DC | PRN

## 2022-08-27 MED ORDER — LACTATED RINGERS IV SOLN: INTRAVENOUS | Status: DC

## 2022-08-27 SURGICAL SUPPLY — 39 items
ADH SKN CLS APL DERMABOND .7 (GAUZE/BANDAGES/DRESSINGS) ×4
APL PRP STRL LF DISP 70% ISPRP (MISCELLANEOUS) ×2
CHLORAPREP W/TINT 26 (MISCELLANEOUS) ×2 IMPLANT
CLOTH BEACON ORANGE TIMEOUT ST (SAFETY) ×2 IMPLANT
COVER LIGHT HANDLE STERIS (MISCELLANEOUS) ×4 IMPLANT
DERMABOND ADVANCED .7 DNX12 (GAUZE/BANDAGES/DRESSINGS) ×2 IMPLANT
ELECT REM PT RETURN 9FT ADLT (ELECTROSURGICAL) ×2
ELECTRODE REM PT RTRN 9FT ADLT (ELECTROSURGICAL) ×2 IMPLANT
GAUZE SPONGE 4X4 12PLY STRL (GAUZE/BANDAGES/DRESSINGS) ×2 IMPLANT
GLOVE BIO SURGEON STRL SZ 6.5 (GLOVE) ×2 IMPLANT
GLOVE BIOGEL PI IND STRL 6.5 (GLOVE) ×2 IMPLANT
GLOVE BIOGEL PI IND STRL 7.0 (GLOVE) ×4 IMPLANT
GOWN STRL REUS W/TWL LRG LVL3 (GOWN DISPOSABLE) ×4 IMPLANT
INST SET MAJOR GENERAL (KITS) ×2 IMPLANT
KIT TURNOVER KIT A (KITS) ×2 IMPLANT
MANIFOLD NEPTUNE II (INSTRUMENTS) ×2 IMPLANT
NDL HYPO 21X1.5 SAFETY (NEEDLE) ×1 IMPLANT
NEEDLE HYPO 21X1.5 SAFETY (NEEDLE) ×2 IMPLANT
NS IRRIG 1000ML POUR BTL (IV SOLUTION) ×2 IMPLANT
PACK MAJOR ABDOMINAL (CUSTOM PROCEDURE TRAY) ×2 IMPLANT
PAD ARMBOARD 7.5X6 YLW CONV (MISCELLANEOUS) ×2 IMPLANT
SET BASIN LINEN APH (SET/KITS/TRAYS/PACK) ×2 IMPLANT
SPONGE T-LAP 18X18 ~~LOC~~+RFID (SPONGE) ×2 IMPLANT
STAPLER VISISTAT (STAPLE) IMPLANT
SUT ETHIBOND NAB MO 7 #0 18IN (SUTURE) IMPLANT
SUT MNCRL AB 4-0 PS2 18 (SUTURE) IMPLANT
SUT NOVA NAB GS-21 1 T12 (SUTURE) IMPLANT
SUT NOVA NAB GS-22 2 2-0 T-19 (SUTURE) IMPLANT
SUT NOVA NAB GS-26 0 60 (SUTURE) IMPLANT
SUT SILK 2 0 (SUTURE)
SUT SILK 2-0 18XBRD TIE 12 (SUTURE) IMPLANT
SUT VIC AB 2-0 CT1 27 (SUTURE) ×4
SUT VIC AB 2-0 CT1 TAPERPNT 27 (SUTURE) ×2 IMPLANT
SUT VIC AB 2-0 CT2 27 (SUTURE) IMPLANT
SUT VIC AB 3-0 SH 27 (SUTURE)
SUT VIC AB 3-0 SH 27X BRD (SUTURE) IMPLANT
SUT VICRYL 3 0 (SUTURE) ×1 IMPLANT
SUT VICRYL AB 2 0 TIES (SUTURE) ×2 IMPLANT
SYR 30ML LL (SYRINGE) ×3 IMPLANT

## 2022-08-27 NOTE — Progress Notes (Signed)
[facility]  Updated husband. Rx sent in. Removed mass with good margins. Will see in 2 weeks to check on incision.   Limit stretching and pulling on the incision. No lifting > 10 lbs for the next 1-2 weeks.   Algis Greenhouse, MD [facility] [address]aring [place], [place] [address] [phone] (office)

## 2022-08-27 NOTE — Anesthesia Procedure Notes (Signed)
Procedure Name: Intubation Date/Time: 08/27/2022 8:24 AM  Performed by: Oletha Cruel, CRNAPre-anesthesia Checklist: Patient identified, Emergency Drugs available, Suction available, Patient being monitored and Timeout performed Patient Re-evaluated:Patient Re-evaluated prior to induction Oxygen Delivery Method: Circle system utilized Preoxygenation: Pre-oxygenation with 100% oxygen Induction Type: IV induction Ventilation: Mask ventilation without difficulty Laryngoscope Size: Mac and 4 Grade View: Grade III Tube type: Oral Tube size: 7.5 mm Number of attempts: 1 Airway Equipment and Method: Stylet Placement Confirmation: ETT inserted through vocal cords under direct vision, positive ETCO2 and breath sounds checked- equal and bilateral Secured at: 22 cm Tube secured with: Tape Dental Injury: Teeth and Oropharynx as per pre-operative assessment

## 2022-08-27 NOTE — Anesthesia Postprocedure Evaluation (Signed)
Anesthesia Post Note  Patient: Debra Le  Procedure(s) Performed: EXCISION MASS ABDOMINAL WALL, ENDOMETRIOSIS (Abdomen)  Patient location during evaluation: Phase II Anesthesia Type: General Level of consciousness: awake and alert and oriented Pain management: pain level controlled Vital Signs Assessment: post-procedure vital signs reviewed and stable Respiratory status: spontaneous breathing, nonlabored ventilation and respiratory function stable Cardiovascular status: blood pressure returned to baseline and stable Postop Assessment: no apparent nausea or vomiting Anesthetic complications: no  No notable events documented.   Last Vitals:  Vitals:   08/27/22 1005 08/27/22 1019  BP:  (!) 147/97  Pulse: 64 (!) 59  Resp: 13 16  Temp:  36.7 C  SpO2: 97% 97%    Last Pain:  Vitals:   08/27/22 1019  TempSrc: Oral  PainSc: 1                  Naod Sweetland C Takima Encina

## 2022-08-27 NOTE — Discharge Instructions (Signed)
Discharge Instructions:  Common Complaints: Pain at the incision site is common.  Some nausea is common and poor appetite. The main goal is to stay hydrated the first few days after surgery.   Diet/ Activity: Diet as tolerated. You may not have an appetite, but it is important to stay hydrated. Drink 64 ounces of water a day. Your appetite will return with time.  Shower per your regular routine daily.  Do not take hot showers. Take warm showers that are less than 10 minutes to prevent the glue from peeling up. No submerging in a tub or pool.  Walk everyday for at least 15-20 minutes. Deep cough and move around every 1-2 hours in the first few days after surgery.  Limit excessive movement, lifting > 10 lbs, stretching that pulls on the incision for the next 1-2 weeks. Limit stretching, pulling on your incision and increase this stretching with time.  We want you to move and bend and elevate your arms over your head but do this in a gradual manner over the next few weeks building back up to your normal.  Do not pick at the dermabond glue on your incision sites. This glue film will remain in place for 1-2 weeks and will start to peel off.  Do not place lotions or balms on your incision unless instructed to specifically by Dr. Henreitta Leber.   Medication: Take tylenol and ibuprofen as needed for pain control, alternating every 4-6 hours.  Example:  Tylenol 1000mg  @ 6am, 12noon, 6pm, (Do not exceed 4000mg  of tylenol a day). Ibuprofen 800mg  @ 9am, 3pm, 9pm, 3am (Do not exceed 3600mg  of ibuprofen a day).  Take Roxicodone for breakthrough pain every 4 hours.  Take Colace for constipation related to narcotic pain medication. If you do not have a bowel movement in 2 days, take Miralax over the counter.  Drink plenty of water to also prevent constipation.   Contact Information: If you have questions or concerns, please call our office, [phone], Monday- Thursday 8AM-5PM and Friday 8AM-12Noon.   If it is after hours or on the weekend, please call [facility], [phone], and ask to speak to the surgeon on call for Dr. Henreitta Leber at [facility].

## 2022-08-27 NOTE — Transfer of Care (Signed)
Immediate Anesthesia Transfer of Care Note  Patient: Debra Le  Procedure(s) Performed: EXCISION MASS ABDOMINAL WALL, ENDOMETRIOSIS (Abdomen)  Patient Location: PACU  Anesthesia Type:General  Level of Consciousness: drowsy and patient cooperative  Airway & Oxygen Therapy: Patient Spontanous Breathing and Patient connected to face mask oxygen  Post-op Assessment: Report given to RN and Post -op Vital signs reviewed and stable  Post vital signs: Reviewed and stable  Last Vitals:  Vitals Value Taken Time  BP 128/77 08/27/22 0924  Temp    Pulse 66 08/27/22 0926  Resp 15 08/27/22 0926  SpO2 100 % 08/27/22 0926  Vitals shown include unvalidated device data.  Last Pain:  Vitals:   08/27/22 0658  TempSrc: Oral  PainSc:          Complications: No notable events documented.

## 2022-08-27 NOTE — Op Note (Signed)
[facility] Operative Note  08/27/22  Preoperative Diagnosis: Abdominal wall endometrial implant    Postoperative Diagnosis: Same   Procedure(s) Performed: Wide excision of abdominal wall endometrial implant down to the fascia  (radical excision of 10cm X 8cm skin and adipose tissue, healthy adipose margin around endometrial tissue)    Surgeon: Leatrice Jewels. Henreitta Leber, MD   Assistants: Duane Lope, MD    Anesthesia: General endotracheal   Anesthesiologist: Molli Barrows, MD    Specimens: Endometrial implant abdominal wall    Estimated Blood Loss: Minimal   Blood Replacement: None    Complications: None   Wound Class: Clean    Operative Indications: [name] is a 47 yo who comes in with an endometrial implant at her Pfannenstiel incision after having a hysterectomy and an attempt at removal of this implant at that surgery.  This recurred and has been bleeding and is causing hypertrophic skin scarring. She was referred for excision. We obtained an MRI to identify any intraabdominal extensions and it appears that the endometrial tissue abuts the fascia. The mass was 3.5 cm with finger like projections on the MRI. We discussed wide excision and possible need for fascia defect repair and possible use of mesh pending the need to remove any fascia. Discussed that a wide excision was needed to try to prevent recurrence.  I discussed with her having Dr. Despina Hidden assist to ensure we had adequate margins for this endometrial tissue given his expertise as a gynecologic physician.   Findings: Fibrotic mass with finger like extension to the fascia, just abutting the fascia and peeled off, no fascia taken, wide excision of 10X 8cm including skin and adipose tissue   Procedure: The patient was taken to the operating room and placed supine. General endotracheal anesthesia was induced. Intravenous antibiotics were administered per protocol.  The abdomen and pubic area were prepped  and draped in the usual sterile fashion.   The area was inspected and her hypertrophic scarring on the prior pfannenstiel incision was noted with the endometrial mass extending beneath this and superior to this area.  Given the need for a wide excise for adequate margins to encompass any finger projections or speculations of the mass, we opted to start on the superior side of her abdominal wall, making an elliptical incision well superior to the palpated mass and carried this down  through the subcutaneous tissue with cautery, keeping a healthy margin of adipose tissue between the endometrial implant and the tissue remaining. This was carried down to the fascia superiorly and with a combination of cautery and blunt dissection the endometrial implant was peeled off the fascia which it abutted without disruption of any full thickness of the fascia. There were some minor abrasions of the anterior fascia but this was minimal.  The adipose tissue was then resected with cautery on the inferior margin inverting the mass to get the more inferior area.  The inferior skin margin was then identified once we were well around the mass in the subcutaneous plane.  The skin on the inferior side of the pfannenstiel was incised elliptically, and the skin with scarring, adipose and mass were resected en bloc.  The total resection area was 10cm (right to left) X 8cm (superior to inferior) of skin and adipose tissue and 5cm of thickness from superficial skin to deep adipose. This was a wide radical excision to ensure adequate margins to prevent recurrence.   Hemostasis was achieved. Irrigation was performed. There was adequate reach of her  tissue in her lower panus to not necessitate any fall creation. The deep adipose was closed in layers using 2-0 Vicryl interrupted. Zenrelef was placed in the layers as this was closed for anesthesic.  The skin was closed with a running subcuticular 3-0 Vicryl on a Keith needle and dermabond.    Dr. Despina Hidden was assisting throughout the procedure and was present for the critical portions of the case including helping ensure adequate margins of resection and closer of the defect created.   Final inspection revealed acceptable hemostasis. All counts were correct at the end of the case. The patient was awakened from anesthesia and extubated without complication.  The patient went to the PACU in stable condition.   Algis Greenhouse, MD [facility] [address] [place], [place] [address] [phone] (office)

## 2022-08-27 NOTE — Anesthesia Preprocedure Evaluation (Signed)
Anesthesia Evaluation  Patient identified by MRN, date of birth, ID band Patient awake    Reviewed: Allergy & Precautions, H&P , NPO status , Patient's Chart, lab work & pertinent test results, reviewed documented beta blocker date and time   Airway Mallampati: II  TM Distance: >3 FB Neck ROM: Full    Dental  (+) Dental Advisory Given, Missing   Pulmonary neg pulmonary ROS   Pulmonary exam normal breath sounds clear to auscultation       Cardiovascular Exercise Tolerance: Good hypertension, Pt. on medications and Pt. on home beta blockers Normal cardiovascular exam+ Valvular Problems/Murmurs  Rhythm:Regular Rate:Normal     Neuro/Psych  Headaches PSYCHIATRIC DISORDERS Anxiety Depression       GI/Hepatic Neg liver ROS,GERD  Controlled,,  Endo/Other  diabetes, Well Controlled, Type 2, Oral Hypoglycemic Agents    Renal/GU negative Renal ROS  negative genitourinary   Musculoskeletal negative musculoskeletal ROS (+)    Abdominal   Peds negative pediatric ROS (+)  Hematology  (+) Blood dyscrasia, anemia   Anesthesia Other Findings   Reproductive/Obstetrics negative OB ROS                             Anesthesia Physical Anesthesia Plan  ASA: 2  Anesthesia Plan: General   Post-op Pain Management: Dilaudid IV   Induction: Intravenous  PONV Risk Score and Plan: 4 or greater and Ondansetron and Dexamethasone  Airway Management Planned: Oral ETT  Additional Equipment:   Intra-op Plan:   Post-operative Plan: Extubation in OR  Informed Consent: I have reviewed the patients History and Physical, chart, labs and discussed the procedure including the risks, benefits and alternatives for the proposed anesthesia with the patient or authorized representative who has indicated his/her understanding and acceptance.     Dental advisory given  Plan Discussed with: CRNA and Surgeon  Anesthesia  Plan Comments:        Anesthesia Quick Evaluation

## 2022-08-27 NOTE — Interval H&P Note (Signed)
History and Physical Interval Note:  08/27/2022 8:02 AM  Debra Le  has presented today for surgery, with the diagnosis of ABDOMINAL WALL SOFT TISSUE MASS, 3.5 x 2.8 CM POSSIBLE ENDOMETREOSIS POSSIBLE VENTRAL HERNIA.  The various methods of treatment have been discussed with the patient and family. After consideration of risks, benefits and other options for treatment, the patient has consented to  Procedure(s) with comments: EXCISION MASS ABDOMINAL WALL, ENDOMETRIOSIS (N/A) - pt knows to arrive at 6:30 HERNIA REPAIR VENTRAL ADULT W/ MESH (N/A) as a surgical intervention.  The patient's history has been reviewed, patient examined, no change in status, stable for surgery.  I have reviewed the patient's chart and labs.  Questions were answered to the patient's satisfaction.    Possible mesh if have to remove much fascia. Discussed removing the scar/ hypertrophy of the skin in the incision area too where she has had bleeding.  Lucretia Roers

## 2022-08-28 LAB — SURGICAL PATHOLOGY

## 2022-08-28 NOTE — Progress Notes (Signed)
Endometriosis on pathology. The deep margin is where we were able to peel the endometrioma off the fascia, which was clear of any endometrial tissue. We removed the entire area of endometriosis. Can you let the patient know pathology looks good.

## 2022-09-03 ENCOUNTER — Encounter (HOSPITAL_COMMUNITY): Payer: Self-pay | Admitting: General Surgery

## 2022-09-11 ENCOUNTER — Ambulatory Visit (INDEPENDENT_AMBULATORY_CARE_PROVIDER_SITE_OTHER): Payer: No Typology Code available for payment source | Admitting: General Surgery

## 2022-09-11 ENCOUNTER — Encounter: Payer: Self-pay | Admitting: General Surgery

## 2022-09-11 VITALS — BP 144/85 | HR 64 | Temp 97.5°F | Resp 14 | Ht 70.0 in | Wt 264.0 lb

## 2022-09-11 DIAGNOSIS — N809 Endometriosis, unspecified: Secondary | ICD-10-CM

## 2022-09-11 DIAGNOSIS — R1909 Other intra-abdominal and pelvic swelling, mass and lump: Secondary | ICD-10-CM

## 2022-09-12 ENCOUNTER — Telehealth: Payer: Self-pay | Admitting: Family Medicine

## 2022-09-12 NOTE — Patient Instructions (Signed)
Activity as tolerated Monitor for any pulling/ stretching on the skin in the region and gradually increase Ok to submerge in clean water as incision is closed.

## 2022-09-12 NOTE — Telephone Encounter (Signed)
Patient called and states that she had one little spot that leaked fluid and was wanting to know if this was normal. She has had no fever and it is not hot to the touch.  Sounds like a seroma. Recommended patient keep a small pressure dressing on it and if it starts to look infected to give [place] a call back. Should call us if drainage is thick, yellow/green, increased redness or hot to touch.  Patient verbalized understanding.

## 2022-09-12 NOTE — Progress Notes (Signed)
[facility]  Patient doing well. No major issues. Some soreness but overall healing well. She has not had to take any birth control to control any bleeding since the excision of the mass.  The pathology showed that we got the entire area as the deep margin was down to the fascia and this was peeled off the fascia leaving no endometriosis where it abutted the fascia.   BP (!) 144/85   Pulse 64   Temp (!) 97.5 F (36.4 C) (Oral)   Resp 14   Ht 5\' 10"  (1.778 m)   Wt 264 lb (119.7 kg)   LMP 08/29/2020   SpO2 98%   BMI 37.88 kg/m  Pfannenstiel type incision healing well, no erythema or drainage, closed  Patient s/p excision of endometriosis implant. Doing well and healing.   Activity as tolerated Monitor for any pulling/ stretching on the skin in the region and gradually increase Ok to submerge in clean water as incision is closed.   PRN Follow up   Algis Greenhouse, MD [facility] [address] [place], [place] [address] [phone] (office)

## 2022-10-16 ENCOUNTER — Other Ambulatory Visit: Payer: Self-pay | Admitting: Internal Medicine

## 2022-11-14 ENCOUNTER — Ambulatory Visit: Payer: No Typology Code available for payment source | Admitting: Internal Medicine

## 2022-11-20 ENCOUNTER — Encounter (INDEPENDENT_AMBULATORY_CARE_PROVIDER_SITE_OTHER): Payer: Self-pay

## 2022-11-21 ENCOUNTER — Other Ambulatory Visit: Payer: Self-pay | Admitting: Internal Medicine

## 2022-12-16 ENCOUNTER — Telehealth: Payer: Self-pay | Admitting: Internal Medicine

## 2022-12-16 NOTE — Telephone Encounter (Signed)
Patient said she has been unable to get her Greggory Keen at her regular pharmacy. She would like to know if it can be sent to the [facility] pharmacy on [address]. Best callback is [phone].

## 2022-12-18 ENCOUNTER — Other Ambulatory Visit: Payer: Self-pay

## 2022-12-18 MED ORDER — MOUNJARO 15 MG/0.5ML ~~LOC~~ SOAJ: SUBCUTANEOUS | 1 refills | Status: DC

## 2022-12-18 NOTE — Telephone Encounter (Signed)
Script sent in for patient today. °

## 2022-12-26 ENCOUNTER — Encounter: Payer: No Typology Code available for payment source | Admitting: Internal Medicine

## 2023-01-08 ENCOUNTER — Encounter: Payer: Self-pay | Admitting: Internal Medicine

## 2023-01-08 NOTE — Patient Instructions (Addendum)
Blood work was ordered.   The lab is on the first floor.    Medications changes include :   None    A referral was ordered and someone will call you to schedule an appointment.     Return in about 6 months (around 07/10/2023) for follow up.   Health Maintenance, Female Adopting a healthy lifestyle and getting preventive care are important in promoting health and wellness. Ask your health care provider about: The right schedule for you to have regular tests and exams. Things you can do on your own to prevent diseases and keep yourself healthy. What should I know about diet, weight, and exercise? Eat a healthy diet  Eat a diet that includes plenty of vegetables, fruits, low-fat dairy products, and lean protein. Do not eat a lot of foods that are high in solid fats, added sugars, or sodium. Maintain a healthy weight Body mass index (BMI) is used to identify weight problems. It estimates body fat based on height and weight. Your health care provider can help determine your BMI and help you achieve or maintain a healthy weight. Get regular exercise Get regular exercise. This is one of the most important things you can do for your health. Most adults should: Exercise for at least 150 minutes each week. The exercise should increase your heart rate and make you sweat (moderate-intensity exercise). Do strengthening exercises at least twice a week. This is in addition to the moderate-intensity exercise. Spend less time sitting. Even light physical activity can be beneficial. Watch cholesterol and blood lipids Have your blood tested for lipids and cholesterol at 46 years of age, then have this test every 5 years. Have your cholesterol levels checked more often if: Your lipid or cholesterol levels are high. You are older than 46 years of age. You are at high risk for heart disease. What should I know about cancer screening? Depending on your health history and family history, you  may need to have cancer screening at various ages. This may include screening for: Breast cancer. Cervical cancer. Colorectal cancer. Skin cancer. Lung cancer. What should I know about heart disease, diabetes, and high blood pressure? Blood pressure and heart disease High blood pressure causes heart disease and increases the risk of stroke. This is more likely to develop in people who have high blood pressure readings or are overweight. Have your blood pressure checked: Every 3-5 years if you are 66-44 years of age. Every year if you are 33 years old or older. Diabetes Have regular diabetes screenings. This checks your fasting blood sugar level. Have the screening done: Once every three years after age 55 if you are at a normal weight and have a low risk for diabetes. More often and at a younger age if you are overweight or have a high risk for diabetes. What should I know about preventing infection? Hepatitis B If you have a higher risk for hepatitis B, you should be screened for this virus. Talk with your health care provider to find out if you are at risk for hepatitis B infection. Hepatitis C Testing is recommended for: Everyone born from [birth year] through 1964. Anyone with known risk factors for hepatitis C. Sexually transmitted infections (STIs) Get screened for STIs, including gonorrhea and chlamydia, if: You are sexually active and are younger than 46 years of age. You are older than 46 years of age and your health care provider tells you that you are at risk for this type  of infection. Your sexual activity has changed since you were last screened, and you are at increased risk for chlamydia or gonorrhea. Ask your health care provider if you are at risk. Ask your health care provider about whether you are at high risk for HIV. Your health care provider may recommend a prescription medicine to help prevent HIV infection. If you choose to take medicine to prevent HIV, you should first  get tested for HIV. You should then be tested every 3 months for as long as you are taking the medicine. Pregnancy If you are about to stop having your period (premenopausal) and you may become pregnant, seek counseling before you get pregnant. Take 400 to 800 micrograms (mcg) of folic acid every day if you become pregnant. Ask for birth control (contraception) if you want to prevent pregnancy. Osteoporosis and menopause Osteoporosis is a disease in which the bones lose minerals and strength with aging. This can result in bone fractures. If you are 60 years old or older, or if you are at risk for osteoporosis and fractures, ask your health care provider if you should: Be screened for bone loss. Take a calcium or vitamin D supplement to lower your risk of fractures. Be given hormone replacement therapy (HRT) to treat symptoms of menopause. Follow these instructions at home: Alcohol use Do not drink alcohol if: Your health care provider tells you not to drink. You are pregnant, may be pregnant, or are planning to become pregnant. If you drink alcohol: Limit how much you have to: 0-1 drink a day. Know how much alcohol is in your drink. In the [place] one drink equals one 12 oz bottle of beer (355 mL), one 5 oz glass of wine (148 mL), or one 1 oz glass of hard liquor (44 mL). Lifestyle Do not use any products that contain nicotine or tobacco. These products include cigarettes, chewing tobacco, and vaping devices, such as e-cigarettes. If you need help quitting, ask your health care provider. Do not use street drugs. Do not share needles. Ask your health care provider for help if you need support or information about quitting drugs. General instructions Schedule regular health, dental, and eye exams. Stay current with your vaccines. Tell your health care provider if: You often feel depressed. You have ever been abused or do not feel safe at home. Summary Adopting a healthy lifestyle and  getting preventive care are important in promoting health and wellness. Follow your health care provider's instructions about healthy diet, exercising, and getting tested or screened for diseases. Follow your health care provider's instructions on monitoring your cholesterol and blood pressure. This information is not intended to replace advice given to you by your health care provider. Make sure you discuss any questions you have with your health care provider. Document Revised: 08/13/2020 Document Reviewed: 08/13/2020 Elsevier Patient Education  2024 [employer].

## 2023-01-08 NOTE — Progress Notes (Signed)
Subjective:    Patient ID: Debra Le, female    DOB: Dec 06, 1975, 47 y.o.   MRN: [id number]      HPI Debra Le is here for a Physical exam and her chronic medical problems.      Medications and allergies reviewed with patient and updated if appropriate.  Current Outpatient Medications on File Prior to Visit  Medication Sig Dispense Refill  . amLODipine (NORVASC) 10 MG tablet TAKE 1 TABLET BY MOUTH EVERY DAY 90 tablet 1  . atorvastatin (LIPITOR) 20 MG tablet TAKE 1 TABLET BY MOUTH EVERY DAY 90 tablet 1  . blood glucose meter kit and supplies KIT Dispense based on patient and insurance preference. Use up to four times daily as directed. 1 each 0  . buPROPion ER (WELLBUTRIN SR) 100 MG 12 hr tablet TAKE 1 TABLET BY MOUTH TWICE A DAY 180 tablet 2  . cetirizine (ZYRTEC) 10 MG tablet Take 10 mg by mouth daily as needed for allergies.    . clindamycin (CLEOCIN T) 1 % lotion Apply 1 Application topically 2 (two) times daily as needed (irritation).    Marland Kitchen escitalopram (LEXAPRO) 10 MG tablet TAKE 1 AND 1/2 TABLETS BY MOUTH DAILY 135 tablet 1  . melatonin 5 MG TABS Take 5-10 mg by mouth at bedtime as needed (sleep).    . metFORMIN (GLUCOPHAGE) 500 MG tablet TAKE 1 TABLET(500 MG) BY MOUTH TWICE DAILY WITH A MEAL 180 tablet 1  . metoprolol tartrate (LOPRESSOR) 50 MG tablet TAKE 1 TABLET BY MOUTH TWICE A DAY 180 tablet 1  . Multiple Vitamin (MULTIVITAMIN WITH MINERALS) TABS tablet Take 1 tablet by mouth daily.    . naproxen sodium (ALEVE) 220 MG tablet Take 660 mg by mouth daily as needed (pain).    . ondansetron (ZOFRAN) 4 MG tablet Take 1 tablet (4 mg total) by mouth every 8 (eight) hours as needed. 30 tablet 1  . tirzepatide (MOUNJARO) 15 MG/0.5ML Pen INJECT 15 MG INTO THE SKIN ONCE A WEEK. 6 mL 1   No current facility-administered medications on file prior to visit.    Review of Systems     Objective:  There were no vitals filed for this visit. There were no vitals filed for this  visit. There is no height or weight on file to calculate BMI.  BP Readings from Last 3 Encounters:  09/11/22 (!) 144/85  08/27/22 (!) 147/97  07/29/22 111/75    Wt Readings from Last 3 Encounters:  09/11/22 264 lb (119.7 kg)  08/27/22 265 lb (120.2 kg)  08/25/22 265 lb (120.2 kg)       Physical Exam Constitutional: She appears well-developed and well-nourished. No distress.  HENT:  Head: Normocephalic and atraumatic.  Right Ear: External ear normal. Normal ear canal and TM Left Ear: External ear normal.  Normal ear canal and TM Mouth/Throat: Oropharynx is clear and moist.  Eyes: Conjunctivae normal.  Neck: Neck supple. No tracheal deviation present. No thyromegaly present.  No carotid bruit  Cardiovascular: Normal rate, regular rhythm and normal heart sounds.   No murmur heard.  No edema. Pulmonary/Chest: Effort normal and breath sounds normal. No respiratory distress. She has no wheezes. She has no rales.  Breast: deferred   Abdominal: Soft. She exhibits no distension. There is no tenderness.  Lymphadenopathy: She has no cervical adenopathy.  Skin: Skin is warm and dry. She is not diaphoretic.  Psychiatric: She has a normal mood and affect. Her behavior is normal.     Lab  Results  Component Value Date   WBC 7.9 06/27/2022   HGB 12.6 06/27/2022   HCT 37.4 06/27/2022   PLT 353.0 06/27/2022   GLUCOSE 84 06/27/2022   CHOL 150 06/27/2022   TRIG 50.0 06/27/2022   HDL 49.60 06/27/2022   LDLCALC 90 06/27/2022   ALT 26 06/27/2022   AST 26 06/27/2022   NA 139 06/27/2022   K 4.3 06/27/2022   CL 103 06/27/2022   CREATININE 1.03 06/27/2022   BUN 10 06/27/2022   CO2 28 06/27/2022   TSH 1.09 12/17/2021   HGBA1C 5.6 06/27/2022   MICROALBUR 1.4 12/17/2021         Assessment & Plan:   Physical exam: Screening blood work  ordered Exercise   Weight   Substance abuse  none   Reviewed recommended immunizations.   Health Maintenance  Topic Date Due  . COVID-19  Vaccine (2 - Pfizer risk series) 03/14/2020  . OPHTHALMOLOGY EXAM  09/29/2020  . FOOT EXAM  11/24/2020  . Colonoscopy  Never done  . INFLUENZA VACCINE  11/06/2022  . Diabetic kidney evaluation - Urine ACR  12/18/2022  . MAMMOGRAM  01/16/2023  . HEMOGLOBIN A1C  12/28/2022  . Diabetic kidney evaluation - eGFR measurement  06/27/2023  . DTaP/Tdap/Td (3 - Td or Tdap) 04/23/2028  . Hepatitis C Screening  Completed  . HIV Screening  Completed  . HPV VACCINES  Aged Out          See Problem List for Assessment and Plan of chronic medical problems.     This encounter was created in error - please disregard.

## 2023-01-09 ENCOUNTER — Encounter: Payer: No Typology Code available for payment source | Admitting: Internal Medicine

## 2023-01-09 ENCOUNTER — Encounter: Payer: Self-pay | Admitting: Internal Medicine

## 2023-01-09 DIAGNOSIS — E559 Vitamin D deficiency, unspecified: Secondary | ICD-10-CM

## 2023-01-09 DIAGNOSIS — F419 Anxiety disorder, unspecified: Secondary | ICD-10-CM

## 2023-01-09 DIAGNOSIS — I10 Essential (primary) hypertension: Secondary | ICD-10-CM

## 2023-01-09 DIAGNOSIS — Z0000 Encounter for general adult medical examination without abnormal findings: Secondary | ICD-10-CM

## 2023-01-09 DIAGNOSIS — E7849 Other hyperlipidemia: Secondary | ICD-10-CM

## 2023-01-09 DIAGNOSIS — F3289 Other specified depressive episodes: Secondary | ICD-10-CM

## 2023-01-09 DIAGNOSIS — E1169 Type 2 diabetes mellitus with other specified complication: Secondary | ICD-10-CM

## 2023-01-09 DIAGNOSIS — K219 Gastro-esophageal reflux disease without esophagitis: Secondary | ICD-10-CM

## 2023-01-09 NOTE — Assessment & Plan Note (Signed)
Chronic GERD controlled Continue Nexium 20 mg daily 

## 2023-01-09 NOTE — Assessment & Plan Note (Signed)
Chronic Currently taking Mounjaro 15 mg weekly Continue decrease portions, regular exercise

## 2023-01-09 NOTE — Assessment & Plan Note (Signed)
Chronic ?Regular exercise and healthy diet encouraged ?Check lipid panel, TSH, CMP ?Continue atorvastatin 20 mg daily ?

## 2023-01-09 NOTE — Assessment & Plan Note (Signed)
Chronic Blood pressure well controlled CMP, CBC Continue metoprolol 50 mg twice daily, amlodipine 10 mg daily

## 2023-01-09 NOTE — Assessment & Plan Note (Signed)
Chronic Taking vitamin D daily Check vitamin D level  

## 2023-01-09 NOTE — Assessment & Plan Note (Signed)
Chronic Controlled, Stable Continue Wellbutrin 100 mg twice daily and Lexapro 15 mg daily

## 2023-01-09 NOTE — Assessment & Plan Note (Signed)
Chronic   Lab Results  Component Value Date   HGBA1C 5.6 06/27/2022   Sugars well controlled Testing sugars 1 times a day Check A1c, urine microalbumin Continue Mounjaro 15 mg weekly, metformin 500 mg twice daily Stressed regular exercise, diabetic diet

## 2023-05-08 ENCOUNTER — Ambulatory Visit: Payer: No Typology Code available for payment source | Admitting: Internal Medicine

## 2023-05-11 ENCOUNTER — Encounter: Payer: Self-pay | Admitting: Internal Medicine

## 2023-05-11 NOTE — Progress Notes (Signed)
 Subjective:    Patient ID: Debra Le, female    DOB: 09-Feb-1976, 48 y.o.   MRN: [id number]     HPI Kayonna is here for follow up of her chronic medical problems.  Overall doing well.  Not exercising as much - eager to start walking outside.    Medications and allergies reviewed with patient and updated if appropriate.  Current Outpatient Medications on File Prior to Visit  Medication Sig Dispense Refill   amLODipine  (NORVASC ) 10 MG tablet TAKE 1 TABLET BY MOUTH EVERY DAY 90 tablet 1   atorvastatin  (LIPITOR) 20 MG tablet TAKE 1 TABLET BY MOUTH EVERY DAY 90 tablet 1   blood glucose meter kit and supplies KIT Dispense based on patient and insurance preference. Use up to four times daily as directed. 1 each 0   buPROPion  ER (WELLBUTRIN  SR) 100 MG 12 hr tablet TAKE 1 TABLET BY MOUTH TWICE A DAY 180 tablet 2   cetirizine (ZYRTEC) 10 MG tablet Take 10 mg by mouth daily as needed for allergies.     clindamycin (CLEOCIN T) 1 % lotion Apply 1 Application topically 2 (two) times daily as needed (irritation).     escitalopram  (LEXAPRO ) 10 MG tablet TAKE 1 AND 1/2 TABLETS BY MOUTH DAILY 135 tablet 1   melatonin 5 MG TABS Take 5-10 mg by mouth at bedtime as needed (sleep).     metFORMIN  (GLUCOPHAGE ) 500 MG tablet TAKE 1 TABLET(500 MG) BY MOUTH TWICE DAILY WITH A MEAL 180 tablet 1   metoprolol  tartrate (LOPRESSOR ) 50 MG tablet TAKE 1 TABLET BY MOUTH TWICE A DAY 180 tablet 1   Multiple Vitamin (MULTIVITAMIN WITH MINERALS) TABS tablet Take 1 tablet by mouth daily.     naproxen sodium (ALEVE) 220 MG tablet Take 660 mg by mouth daily as needed (pain).     ondansetron  (ZOFRAN ) 4 MG tablet Take 1 tablet (4 mg total) by mouth every 8 (eight) hours as needed. 30 tablet 1   tirzepatide  (MOUNJARO ) 15 MG/0.5ML Pen INJECT 15 MG INTO THE SKIN ONCE A WEEK. 6 mL 1   No current facility-administered medications on file prior to visit.     Review of Systems  Constitutional:  Negative for fever.   Respiratory:  Negative for cough, shortness of breath and wheezing.   Cardiovascular:  Negative for chest pain, palpitations and leg swelling.  Gastrointestinal:  Positive for constipation (mild).       No gerd  Neurological:  Negative for light-headedness and headaches.       Objective:   Vitals:   05/12/23 1443  BP: 118/76  Pulse: 84  Temp: 97.9 F (36.6 C)  SpO2: 97%   BP Readings from Last 3 Encounters:  05/12/23 118/76  09/11/22 (!) 144/85  08/27/22 (!) 147/97   Wt Readings from Last 3 Encounters:  05/12/23 245 lb (111.1 kg)  09/11/22 264 lb (119.7 kg)  08/27/22 265 lb (120.2 kg)   Body mass index is 35.15 kg/m.    Physical Exam Constitutional:      General: She is not in acute distress.    Appearance: Normal appearance.  HENT:     Head: Normocephalic and atraumatic.  Eyes:     Conjunctiva/sclera: Conjunctivae normal.  Cardiovascular:     Rate and Rhythm: Normal rate and regular rhythm.     Heart sounds: Normal heart sounds.  Pulmonary:     Effort: Pulmonary effort is normal. No respiratory distress.     Breath sounds: Normal  breath sounds. No wheezing.  Musculoskeletal:     Cervical back: Neck supple.     Right lower leg: No edema.     Left lower leg: No edema.  Lymphadenopathy:     Cervical: No cervical adenopathy.  Skin:    General: Skin is warm and dry.     Findings: No rash.  Neurological:     Mental Status: She is alert. Mental status is at baseline.  Psychiatric:        Mood and Affect: Mood normal.        Behavior: Behavior normal.        Lab Results  Component Value Date   WBC 7.9 06/27/2022   HGB 12.6 06/27/2022   HCT 37.4 06/27/2022   PLT 353.0 06/27/2022   GLUCOSE 84 06/27/2022   CHOL 150 06/27/2022   TRIG 50.0 06/27/2022   HDL 49.60 06/27/2022   LDLCALC 90 06/27/2022   ALT 26 06/27/2022   AST 26 06/27/2022   NA 139 06/27/2022   K 4.3 06/27/2022   CL 103 06/27/2022   CREATININE 1.03 06/27/2022   BUN 10 06/27/2022    CO2 28 06/27/2022   TSH 1.09 12/17/2021   HGBA1C 5.6 06/27/2022   MICROALBUR 1.4 12/17/2021     Assessment & Plan:    See Problem List for Assessment and Plan of chronic medical problems.

## 2023-05-11 NOTE — Patient Instructions (Addendum)
    Pneumonia 23 vaccine given today    Blood work was ordered.       Medications changes include :   None     Return in about 6 months (around 11/09/2023) for Physical Exam.   [facility] [address] [place], [place] [address] [phone]  [facility]  [facility]  [address], [place], [place] [address] Phone: [phone]  [facility] [address] [place], [place], [place] [address] Phone: [phone]

## 2023-05-12 ENCOUNTER — Other Ambulatory Visit: Payer: Self-pay

## 2023-05-12 ENCOUNTER — Ambulatory Visit: Payer: No Typology Code available for payment source | Admitting: Internal Medicine

## 2023-05-12 VITALS — BP 118/76 | HR 84 | Temp 97.9°F | Ht 70.0 in | Wt 245.0 lb

## 2023-05-12 DIAGNOSIS — E7849 Other hyperlipidemia: Secondary | ICD-10-CM | POA: Diagnosis not present

## 2023-05-12 DIAGNOSIS — F3289 Other specified depressive episodes: Secondary | ICD-10-CM | POA: Diagnosis not present

## 2023-05-12 DIAGNOSIS — Z9189 Other specified personal risk factors, not elsewhere classified: Secondary | ICD-10-CM | POA: Diagnosis not present

## 2023-05-12 DIAGNOSIS — Z8701 Personal history of pneumonia (recurrent): Secondary | ICD-10-CM | POA: Diagnosis not present

## 2023-05-12 DIAGNOSIS — F419 Anxiety disorder, unspecified: Secondary | ICD-10-CM

## 2023-05-12 DIAGNOSIS — Z23 Encounter for immunization: Secondary | ICD-10-CM

## 2023-05-12 DIAGNOSIS — E1169 Type 2 diabetes mellitus with other specified complication: Secondary | ICD-10-CM

## 2023-05-12 DIAGNOSIS — I10 Essential (primary) hypertension: Secondary | ICD-10-CM | POA: Diagnosis not present

## 2023-05-12 DIAGNOSIS — Z7984 Long term (current) use of oral hypoglycemic drugs: Secondary | ICD-10-CM

## 2023-05-12 LAB — LIPID PANEL
Cholesterol: 124 mg/dL (ref 0–200)
HDL: 45.2 mg/dL (ref >39.00)
LDL Cholesterol: 71 mg/dL (ref 0–99)
NonHDL: 78.62
Total CHOL/HDL Ratio: 3
Triglycerides: 36 mg/dL (ref 0.0–149.0)
VLDL: 7.2 mg/dL (ref 0.0–40.0)

## 2023-05-12 LAB — COMPREHENSIVE METABOLIC PANEL
ALT: 27 U/L (ref 0–35)
AST: 26 U/L (ref 0–37)
Albumin: 4.4 g/dL (ref 3.5–5.2)
Alkaline Phosphatase: 112 U/L (ref 39–117)
BUN: 10 mg/dL (ref 6–23)
CO2: 27 meq/L (ref 19–32)
Calcium: 9.8 mg/dL (ref 8.4–10.5)
Chloride: 102 meq/L (ref 96–112)
Creatinine, Ser: 0.92 mg/dL (ref 0.40–1.20)
GFR: 74.37 mL/min (ref >60.00)
Glucose, Bld: 89 mg/dL (ref 70–99)
Potassium: 3.7 meq/L (ref 3.5–5.1)
Sodium: 136 meq/L (ref 135–145)
Total Bilirubin: 0.4 mg/dL (ref 0.2–1.2)
Total Protein: 8.3 g/dL (ref 6.0–8.3)

## 2023-05-12 LAB — HEMOGLOBIN A1C: Hgb A1c MFr Bld: 5.4 % (ref 4.6–6.5)

## 2023-05-12 MED ORDER — TIRZEPATIDE 15 MG/0.5ML ~~LOC~~ SOAJ: 15.0000 mg | SUBCUTANEOUS | 5 refills | Status: DC

## 2023-05-12 NOTE — Assessment & Plan Note (Addendum)
Chronic Currently taking Mounjaro 15 mg weekly Stressed regular exercise and increase protein intake Discussed that she needs to be eating regularly

## 2023-05-12 NOTE — Assessment & Plan Note (Signed)
 Chronic  Lab Results  Component Value Date   HGBA1C 5.6 06/27/2022   Sugars well controlled Testing sugars 1 times a day Check A1c, urine microalbumin Continue Mounjaro  15 mg weekly, metformin  500 mg twice daily Stressed regular exercise, diabetic diet, weight loss

## 2023-05-12 NOTE — Addendum Note (Signed)
Addended by: Karma Ganja on: 05/12/2023 04:30 PM   Modules accepted: Orders

## 2023-05-12 NOTE — Assessment & Plan Note (Signed)
Chronic Blood pressure well controlled CMP, CBC Continue metoprolol 50 mg twice daily, amlodipine 10 mg daily

## 2023-05-12 NOTE — Assessment & Plan Note (Signed)
Chronic Controlled, Stable Continue Wellbutrin 100 mg twice daily and Lexapro 15 mg daily

## 2023-05-12 NOTE — Assessment & Plan Note (Signed)
Chronic Regular exercise and healthy diet encouraged Check lipid panel, CMP Continue atorvastatin 20 mg daily 

## 2023-05-13 LAB — MICROALBUMIN / CREATININE URINE RATIO
Creatinine,U: 361.6 mg/dL
Microalb Creat Ratio: 0.5 mg/g (ref 0.0–30.0)
Microalb, Ur: 1.9 mg/dL (ref 0.0–1.9)

## 2023-05-14 ENCOUNTER — Encounter: Payer: Self-pay | Admitting: Internal Medicine

## 2023-08-29 ENCOUNTER — Other Ambulatory Visit: Payer: Self-pay | Admitting: Internal Medicine

## 2023-11-09 ENCOUNTER — Encounter: Payer: No Typology Code available for payment source | Admitting: Internal Medicine

## 2023-12-24 ENCOUNTER — Other Ambulatory Visit: Payer: Self-pay | Admitting: Internal Medicine

## 2023-12-31 ENCOUNTER — Telehealth: Payer: Self-pay

## 2023-12-31 ENCOUNTER — Encounter: Admitting: Nurse Practitioner

## 2023-12-31 DIAGNOSIS — U071 COVID-19: Secondary | ICD-10-CM

## 2023-12-31 NOTE — Telephone Encounter (Signed)
 Copied from CRM #[id number]. Topic: Clinical - Medication Question >> Dec 31, 2023  8:18 AM Carlyon D wrote: Reason for CRM: Pt had an appt tomorrow she cancelled due to having covid pt is asking is pcp would like to send her in some medication for covid.  Preferred pharmacy: [id number] [place], [place] - [address] [place] [address] Phone: [phone] Fax: [phone]   Please reach out to pt in regards to status update on medication for covid

## 2023-12-31 NOTE — Progress Notes (Signed)
 Positive COVID. Needs to schedule video visit

## 2024-01-01 ENCOUNTER — Encounter: Admitting: Internal Medicine

## 2024-01-18 ENCOUNTER — Other Ambulatory Visit: Payer: Self-pay | Admitting: Internal Medicine

## 2024-02-08 ENCOUNTER — Telehealth: Payer: Self-pay

## 2024-02-08 DIAGNOSIS — L732 Hidradenitis suppurativa: Secondary | ICD-10-CM | POA: Insufficient documentation

## 2024-02-08 NOTE — Telephone Encounter (Signed)
 Copied from CRM [id number]. Topic: Clinical - Medical Advice >> Feb 08, 2024  9:59 AM Anairis L wrote: Reason for CRM: Dermatologist diagnosed with HS and prescribed spironolactone  25 mg side effects heart palpitation and muscle tension.

## 2024-02-08 NOTE — Telephone Encounter (Signed)
 Noted.  She has appointment on tomorrow

## 2024-02-08 NOTE — Progress Notes (Unsigned)
    Subjective:    Patient ID: Marisue Canion, female    DOB: 10/21/1975, 48 y.o.   MRN: [id number]      HPI Eleni is here for No chief complaint on file.   Dx wth Hidradenitis Suppurativa by derm and was started on spironolactone.       Medications and allergies reviewed with patient and updated if appropriate.  Current Outpatient Medications on File Prior to Visit  Medication Sig Dispense Refill   amLODipine  (NORVASC ) 10 MG tablet TAKE 1 TABLET BY MOUTH EVERY DAY 90 tablet 1   atorvastatin  (LIPITOR) 20 MG tablet TAKE 1 TABLET BY MOUTH EVERY DAY 90 tablet 1   blood glucose meter kit and supplies KIT Dispense based on patient and insurance preference. Use up to four times daily as directed. 1 each 0   buPROPion  ER (WELLBUTRIN  SR) 100 MG 12 hr tablet TAKE 1 TABLET BY MOUTH TWICE A DAY 180 tablet 2   cetirizine (ZYRTEC) 10 MG tablet Take 10 mg by mouth daily as needed for allergies.     clindamycin (CLEOCIN T) 1 % lotion Apply 1 Application topically 2 (two) times daily as needed (irritation).     escitalopram  (LEXAPRO ) 10 MG tablet TAKE 1 AND 1/2 TABLETS BY MOUTH DAILY 135 tablet 1   melatonin 5 MG TABS Take 5-10 mg by mouth at bedtime as needed (sleep).     metFORMIN  (GLUCOPHAGE ) 500 MG tablet TAKE 1 TABLET(500 MG) BY MOUTH TWICE DAILY WITH A MEAL 180 tablet 1   metoprolol  tartrate (LOPRESSOR ) 50 MG tablet TAKE 1 TABLET BY MOUTH TWICE A DAY 180 tablet 1   MOUNJARO  15 MG/0.5ML Pen INJECT 15 MG INTO THE SKIN ONCE A WEEK 4 mL 0   Multiple Vitamin (MULTIVITAMIN WITH MINERALS) TABS tablet Take 1 tablet by mouth daily.     naproxen sodium (ALEVE) 220 MG tablet Take 660 mg by mouth daily as needed (pain).     tirzepatide  (MOUNJARO ) 15 MG/0.5ML Pen Inject 15 mg into the skin once a week. 2 mL 5   No current facility-administered medications on file prior to visit.    Review of Systems     Objective:  There were no vitals filed for this visit. BP Readings from Last 3 Encounters:   05/12/23 118/76  09/11/22 (!) 144/85  08/27/22 (!) 147/97   Wt Readings from Last 3 Encounters:  05/12/23 245 lb (111.1 kg)  09/11/22 264 lb (119.7 kg)  08/27/22 265 lb (120.2 kg)   There is no height or weight on file to calculate BMI.    Physical Exam         Assessment & Plan:    See Problem List for Assessment and Plan of chronic medical problems.

## 2024-02-09 ENCOUNTER — Encounter: Payer: Self-pay | Admitting: Internal Medicine

## 2024-02-09 ENCOUNTER — Other Ambulatory Visit: Payer: Self-pay

## 2024-02-09 ENCOUNTER — Ambulatory Visit: Admitting: Internal Medicine

## 2024-02-09 VITALS — BP 120/76 | HR 77 | Temp 98.6°F | Ht 70.0 in | Wt 226.0 lb

## 2024-02-09 DIAGNOSIS — L732 Hidradenitis suppurativa: Secondary | ICD-10-CM | POA: Diagnosis not present

## 2024-02-09 DIAGNOSIS — R002 Palpitations: Secondary | ICD-10-CM | POA: Diagnosis not present

## 2024-02-09 DIAGNOSIS — R252 Cramp and spasm: Secondary | ICD-10-CM | POA: Diagnosis not present

## 2024-02-09 DIAGNOSIS — I10 Essential (primary) hypertension: Secondary | ICD-10-CM

## 2024-02-09 LAB — MAGNESIUM: Magnesium: 1.6 mg/dL (ref 1.5–2.5)

## 2024-02-09 LAB — COMPREHENSIVE METABOLIC PANEL WITH GFR
ALT: 18 U/L (ref 0–35)
AST: 20 U/L (ref 0–37)
Albumin: 4.5 g/dL (ref 3.5–5.2)
Alkaline Phosphatase: 87 U/L (ref 39–117)
BUN: 13 mg/dL (ref 6–23)
CO2: 28 meq/L (ref 19–32)
Calcium: 10.1 mg/dL (ref 8.4–10.5)
Chloride: 99 meq/L (ref 96–112)
Creatinine, Ser: 1.06 mg/dL (ref 0.40–1.20)
GFR: 62.42 mL/min (ref >60.00)
Glucose, Bld: 79 mg/dL (ref 70–99)
Potassium: 3.3 meq/L — ABNORMAL LOW (ref 3.5–5.1)
Sodium: 135 meq/L (ref 135–145)
Total Bilirubin: 0.6 mg/dL (ref 0.2–1.2)
Total Protein: 8.2 g/dL (ref 6.0–8.3)

## 2024-02-09 LAB — CBC WITH DIFFERENTIAL/PLATELET
Basophils Absolute: 0 K/uL (ref 0.0–0.1)
Basophils Relative: 0.3 % (ref 0.0–3.0)
Eosinophils Absolute: 0 K/uL (ref 0.0–0.7)
Eosinophils Relative: 0.3 % (ref 0.0–5.0)
HCT: 36.3 % (ref 36.0–46.0)
Hemoglobin: 12 g/dL (ref 12.0–15.0)
Lymphocytes Relative: 40.2 % (ref 12.0–46.0)
Lymphs Abs: 2.7 K/uL (ref 0.7–4.0)
MCHC: 33 g/dL (ref 30.0–36.0)
MCV: 89.3 fl (ref 78.0–100.0)
Monocytes Absolute: 0.5 K/uL (ref 0.1–1.0)
Monocytes Relative: 7.3 % (ref 3.0–12.0)
Neutro Abs: 3.5 K/uL (ref 1.4–7.7)
Neutrophils Relative %: 51.9 % (ref 43.0–77.0)
Platelets: 285 K/uL (ref 150.0–400.0)
RBC: 4.07 Mil/uL (ref 3.87–5.11)
RDW: 13.4 % (ref 11.5–15.5)
WBC: 6.8 K/uL (ref 4.0–10.5)

## 2024-02-09 MED ORDER — METFORMIN HCL 500 MG PO TABS: ORAL_TABLET | ORAL | 1 refills | Status: DC

## 2024-02-09 MED ORDER — ESCITALOPRAM OXALATE 10 MG PO TABS: 15.0000 mg | ORAL_TABLET | Freq: Every day | ORAL | 1 refills | Status: DC

## 2024-02-09 MED ORDER — AMLODIPINE BESYLATE 10 MG PO TABS: 10.0000 mg | ORAL_TABLET | Freq: Every day | ORAL | 1 refills | Status: DC

## 2024-02-09 MED ORDER — ATORVASTATIN CALCIUM 20 MG PO TABS: 20.0000 mg | ORAL_TABLET | Freq: Every day | ORAL | 1 refills | Status: DC

## 2024-02-09 MED ORDER — METOPROLOL TARTRATE 50 MG PO TABS: 50.0000 mg | ORAL_TABLET | Freq: Two times a day (BID) | ORAL | 1 refills | Status: DC

## 2024-02-09 MED ORDER — MOUNJARO 15 MG/0.5ML ~~LOC~~ SOAJ: SUBCUTANEOUS | 0 refills | Status: DC

## 2024-02-09 NOTE — Assessment & Plan Note (Signed)
 New Has been experiencing palpitations recently She has a history of palpitations in the past, but those are well-controlled with the metoprolol  She is concerned her recent palpitations may be related to spironolactone Check CMP, CBC, magnesium  level

## 2024-02-09 NOTE — Patient Instructions (Signed)
      Blood work was ordered.       Medications changes include :   None

## 2024-02-09 NOTE — Assessment & Plan Note (Signed)
 Chronic Blood pressure well controlled Continue metoprolol  50 mg twice daily, amlodipine  10 mg daily, also on spironolactone 25 mg daily for at bedtime Advised monitoring BP to make sure it does not drop too low

## 2024-02-09 NOTE — Assessment & Plan Note (Signed)
 New Diagnosed by dermatology Currently taking spironolactone 25 mg daily, which has helped

## 2024-02-09 NOTE — Assessment & Plan Note (Signed)
 Acute Has been experiencing muscle cramping Her only change in medication is taking spironolactone for HS She also does not drink enough water which could be contributing CBC, CMP, magnesium  level Increase water intake Will see if there is any electrolyte abnormalities

## 2024-02-10 ENCOUNTER — Ambulatory Visit: Payer: Self-pay | Admitting: Internal Medicine

## 2024-02-10 DIAGNOSIS — R252 Cramp and spasm: Secondary | ICD-10-CM

## 2024-02-26 ENCOUNTER — Other Ambulatory Visit: Payer: Self-pay

## 2024-02-26 ENCOUNTER — Emergency Department (HOSPITAL_BASED_OUTPATIENT_CLINIC_OR_DEPARTMENT_OTHER): Admitting: Radiology

## 2024-02-26 ENCOUNTER — Encounter (HOSPITAL_BASED_OUTPATIENT_CLINIC_OR_DEPARTMENT_OTHER): Payer: Self-pay | Admitting: Emergency Medicine

## 2024-02-26 ENCOUNTER — Emergency Department (HOSPITAL_BASED_OUTPATIENT_CLINIC_OR_DEPARTMENT_OTHER)
Admission: EM | Admit: 2024-02-26 | Discharge: 2024-02-26 | Disposition: A | Discharge status: Home / Self Care | Attending: Emergency Medicine | Admitting: Emergency Medicine

## 2024-02-26 DIAGNOSIS — R1013 Epigastric pain: Secondary | ICD-10-CM | POA: Insufficient documentation

## 2024-02-26 LAB — CBC
HCT: 35.4 % — ABNORMAL LOW (ref 36.0–46.0)
Hemoglobin: 11.6 g/dL — ABNORMAL LOW (ref 12.0–15.0)
MCH: 29.4 pg (ref 26.0–34.0)
MCHC: 32.8 g/dL (ref 30.0–36.0)
MCV: 89.6 fL (ref 80.0–100.0)
Platelets: 260 K/uL (ref 150–400)
RBC: 3.95 MIL/uL (ref 3.87–5.11)
RDW: 13 % (ref 11.5–15.5)
WBC: 6.3 K/uL (ref 4.0–10.5)
nRBC: 0 % (ref 0.0–0.2)

## 2024-02-26 LAB — HEPATIC FUNCTION PANEL
ALT: 17 U/L (ref 0–44)
AST: 19 U/L (ref 15–41)
Albumin: 4.3 g/dL (ref 3.5–5.0)
Alkaline Phosphatase: 99 U/L (ref 38–126)
Bilirubin, Direct: 0.2 mg/dL (ref 0.0–0.2)
Indirect Bilirubin: 0.2 mg/dL — ABNORMAL LOW (ref 0.3–0.9)
Total Bilirubin: 0.4 mg/dL (ref 0.0–1.2)
Total Protein: 7.7 g/dL (ref 6.5–8.1)

## 2024-02-26 LAB — BASIC METABOLIC PANEL WITH GFR
Anion gap: 12 (ref 5–15)
BUN: 9 mg/dL (ref 6–20)
CO2: 27 mmol/L (ref 22–32)
Calcium: 9.8 mg/dL (ref 8.9–10.3)
Chloride: 101 mmol/L (ref 98–111)
Creatinine, Ser: 0.76 mg/dL (ref 0.44–1.00)
GFR, Estimated: >60 mL/min (ref >60)
Glucose, Bld: 88 mg/dL (ref 70–99)
Potassium: 3.7 mmol/L (ref 3.5–5.1)
Sodium: 140 mmol/L (ref 135–145)

## 2024-02-26 LAB — LIPASE, BLOOD: Lipase: 32 U/L (ref 11–51)

## 2024-02-26 LAB — TROPONIN T, HIGH SENSITIVITY: Troponin T High Sensitivity: <15 ng/L (ref 0–19)

## 2024-02-26 MED ORDER — FAMOTIDINE IN NACL 20-0.9 MG/50ML-% IV SOLN
20.0000 mg | Freq: Once | INTRAVENOUS | Status: AC
  Administered 2024-02-26: 20 mg via INTRAVENOUS
  Filled 2024-02-26: qty 50

## 2024-02-26 MED ORDER — LIDOCAINE VISCOUS HCL 2 % MT SOLN
15.0000 mL | Freq: Once | OROMUCOSAL | Status: AC
  Administered 2024-02-26: 15 mL via ORAL
  Filled 2024-02-26: qty 15

## 2024-02-26 MED ORDER — ALUM & MAG HYDROXIDE-SIMETH 200-200-20 MG/5ML PO SUSP
30.0000 mL | Freq: Once | ORAL | Status: AC
  Administered 2024-02-26: 30 mL via ORAL
  Filled 2024-02-26: qty 30

## 2024-02-26 NOTE — ED Provider Notes (Signed)
 [place] EMERGENCY DEPARTMENT AT [facility] Provider Note   CSN: [id number] Arrival date & time: 02/26/24  0501     Patient presents with: Abdominal Pain   Debra Le is a 48 y.o. female.   Patient presents to the emergency department for evaluation of epigastric and chest pain.  Patient reports a history of intermittent episodes of GERD.  Patient started having symptoms 2 days ago.  She does not normally have to take medications daily.  She thought it was simply her GERD, took Maalox, Carafate  and omeprazole but has not improved.  She has persistent epigastric pain that radiates up into the chest with intermittent spasms of sharp pain in the chest.  No vomiting, hematemesis, rectal bleeding or melena.       Prior to Admission medications   Medication Sig Start Date End Date Taking? Authorizing Provider  amLODipine  (NORVASC ) 10 MG tablet Take 1 tablet (10 mg total) by mouth daily. 02/09/24   Geofm Glade PARAS, MD  atorvastatin  (LIPITOR) 20 MG tablet Take 1 tablet (20 mg total) by mouth daily. 02/09/24   Geofm Glade PARAS, MD  blood glucose meter kit and supplies KIT Dispense based on patient and insurance preference. Use up to four times daily as directed. 06/24/16   Geofm Glade PARAS, MD  buPROPion  ER (WELLBUTRIN  SR) 100 MG 12 hr tablet TAKE 1 TABLET BY MOUTH TWICE A DAY 12/24/23   Geofm Glade PARAS, MD  clindamycin (CLEOCIN T) 1 % lotion Apply 1 Application topically 2 (two) times daily as needed (irritation).    [provider]  escitalopram  (LEXAPRO ) 10 MG tablet Take 1.5 tablets (15 mg total) by mouth daily. 02/09/24   Geofm Glade PARAS, MD  metFORMIN  (GLUCOPHAGE ) 500 MG tablet TAKE 1 TABLET(500 MG) BY MOUTH TWICE DAILY WITH A MEAL 02/09/24   Burns, Glade PARAS, MD  metoprolol  tartrate (LOPRESSOR ) 50 MG tablet Take 1 tablet (50 mg total) by mouth 2 (two) times daily. 02/09/24   Geofm Glade PARAS, MD  Multiple Vitamin (MULTIVITAMIN WITH MINERALS) TABS tablet Take 1 tablet by mouth  daily.    [provider]  spironolactone (ALDACTONE) 25 MG tablet Take 25 mg by mouth. 12/08/23 12/07/24  [provider]  tirzepatide  (MOUNJARO ) 15 MG/0.5ML Pen Inject 15 mg into the skin once a week. 05/12/23   Geofm Glade PARAS, MD  tirzepatide  (MOUNJARO ) 15 MG/0.5ML Pen INJECT 15 MG INTO THE SKIN ONCE A WEEK. 02/09/24   Geofm Glade PARAS, MD    Allergies: Patient has no known allergies.    Review of Systems  Updated Vital Signs BP 123/82 (BP Location: Right Arm)   Pulse 77   Temp 98 F (36.7 C) (Oral)   Resp 20   Ht 5' 10 (1.778 m)   Wt 100.2 kg   LMP 08/29/2020   SpO2 100%   BMI 31.71 kg/m   Physical Exam Vitals and nursing note reviewed.  Constitutional:      General: She is not in acute distress.    Appearance: She is well-developed.  HENT:     Head: Normocephalic and atraumatic.     Mouth/Throat:     Mouth: Mucous membranes are moist.  Eyes:     General: Vision grossly intact. Gaze aligned appropriately.     Extraocular Movements: Extraocular movements intact.     Conjunctiva/sclera: Conjunctivae normal.  Cardiovascular:     Rate and Rhythm: Normal rate and regular rhythm.     Pulses: Normal pulses.     Heart  sounds: Normal heart sounds, S1 normal and S2 normal. No murmur heard.    No friction rub. No gallop.  Pulmonary:     Effort: Pulmonary effort is normal. No respiratory distress.     Breath sounds: Normal breath sounds.  Abdominal:     General: Bowel sounds are normal.     Palpations: Abdomen is soft.     Tenderness: There is abdominal tenderness in the epigastric area. There is no guarding or rebound.     Hernia: No hernia is present.  Musculoskeletal:        General: No swelling.     Cervical back: Full passive range of motion without pain, normal range of motion and neck supple. No spinous process tenderness or muscular tenderness. Normal range of motion.     Right lower leg: No edema.     Left lower leg: No edema.  Skin:    General: Skin  is warm and dry.     Capillary Refill: Capillary refill takes less than 2 seconds.     Findings: No ecchymosis, erythema, rash or wound.  Neurological:     General: No focal deficit present.     Mental Status: She is alert and oriented to person, place, and time.     GCS: GCS eye subscore is 4. GCS verbal subscore is 5. GCS motor subscore is 6.     Cranial Nerves: Cranial nerves 2-12 are intact.     Sensory: Sensation is intact.     Motor: Motor function is intact.     Coordination: Coordination is intact.  Psychiatric:        Attention and Perception: Attention normal.        Mood and Affect: Mood normal.        Speech: Speech normal.        Behavior: Behavior normal.     (all labs ordered are listed, but only abnormal results are displayed) Labs Reviewed  CBC - Abnormal; Notable for the following components:      Result Value   Hemoglobin 11.6 (*)    HCT 35.4 (*)    All other components within normal limits  BASIC METABOLIC PANEL WITH GFR  HEPATIC FUNCTION PANEL  LIPASE, BLOOD  TROPONIN T, HIGH SENSITIVITY    EKG: None  Radiology: DG Chest 2 View Result Date: 02/26/2024 EXAM: 2 VIEW(S) XRAY OF THE CHEST 02/26/2024 06:02:00 AM COMPARISON: None available. CLINICAL HISTORY: 48 year old female with epigastric pain. FINDINGS: LUNGS AND PLEURA: No focal pulmonary opacity. No pleural effusion. No pneumothorax. HEART AND MEDIASTINUM: No acute abnormality of the cardiac and mediastinal silhouettes. BONES AND SOFT TISSUES: No acute osseous abnormality. IMPRESSION: 1. No acute cardiopulmonary abnormality. Electronically signed by: Helayne Hurst MD 02/26/2024 06:10 AM EST RP Workstation: [id number]     Procedures   Medications Ordered in the ED  famotidine  (PEPCID ) IVPB 20 mg premix (0 mg Intravenous Stopped 02/26/24 0636)  alum & mag hydroxide-simeth (MAALOX/MYLANTA) 200-200-20 MG/5ML suspension 30 mL (30 mLs Oral Given 02/26/24 0553)    And  lidocaine  (XYLOCAINE ) 2 % viscous  mouth solution 15 mL (15 mLs Oral Given 02/26/24 0553)                                    Medical Decision Making Amount and/or Complexity of Data Reviewed Labs: ordered. Decision-making details documented in ED Course. Radiology: ordered and independent interpretation performed. Decision-making details documented in  ED Course. ECG/medicine tests: ordered and independent interpretation performed. Decision-making details documented in ED Course.  Risk OTC drugs. Prescription drug management.   Differential Diagnosis considered includes, but not limited to: Cholelithiasis; cholecystitis; cholangitis; bowel obstruction; esophagitis; gastritis; peptic ulcer disease; pancreatitis; cardiac.  Presents with epigastric discomfort radiating up into the chest.  Seems unlikely to be cardiac in nature.  EKG without ischemic changes.  Will cycle troponins.  Patient has a history of reflux.  No right upper quadrant tenderness, doubt gallbladder disease as a cause of her symptoms.  Awaiting LFTs, lipase.  Chest x-ray without free air under the diaphragm or acute pathology.  Will sign out to oncoming ER physician to follow these results.     Final diagnoses:  Epigastric pain    ED Discharge Orders     None          Haze Lonni PARAS, MD 02/26/24 [phone]

## 2024-02-26 NOTE — ED Triage Notes (Signed)
  Patient comes in with 2 day hx of epigastric pain and reflux.  Patient states she has hx of reflux and has taken several doses of alka seltzer, omeprazole, maalox, and carafate  with little to no relief.  Patient denies any SOB.  States her reflux has not been this bad in a long time.  Denies any N/V/D.  Afebrile.  No other complaints.  Pain 6/10, burning/sharp.

## 2024-02-26 NOTE — Discharge Instructions (Signed)
 Try pepcid  or tagamet up to twice a day.  Try to avoid things that may make this worse, most commonly these are spicy foods tomato based products fatty foods chocolate and peppermint.  Alcohol and tobacco can also make this worse.  Return to the emergency department for sudden worsening pain fever or inability to eat or drink.

## 2024-02-26 NOTE — ED Provider Notes (Signed)
 Received patient in turnover from Dr. Haze.  Please see their note for further details of Hx, PE.  Briefly patient is a 48 y.o. female with a Abdominal Pain .  Epigastric pain.  Plan for labs, reassess.  Troponin negative.  Chest x-ray on my independent interpretation without focal infiltrate or pneumothorax.  No significant electrolyte abnormalities.  LFTs and lipase are unremarkable.  I reassessed the patient and she is feeling a bit better.  Per her she has not had any significant change in her discomfort in at least a day or 2.  I do not feel she benefit from a delta.  Will treat as reflux.  Give GI follow-up.    Emil Share, DO 02/26/24 [phone]

## 2024-03-19 ENCOUNTER — Other Ambulatory Visit: Payer: Self-pay | Admitting: Internal Medicine

## 2024-04-17 ENCOUNTER — Encounter: Payer: Self-pay | Admitting: Internal Medicine

## 2024-04-17 NOTE — Patient Instructions (Addendum)
 "     Blood work was ordered.       Medications changes include :   None    A referral was ordered and someone will call you to schedule an appointment.     Return in about 6 months (around 10/16/2024) for follow up.    Health Maintenance, Female Adopting a healthy lifestyle and getting preventive care are important in promoting health and wellness. Ask your health care provider about: The right schedule for you to have regular tests and exams. Things you can do on your own to prevent diseases and keep yourself healthy. What should I know about diet, weight, and exercise? Eat a healthy diet  Eat a diet that includes plenty of vegetables, fruits, low-fat dairy products, and lean protein. Do not eat a lot of foods that are high in solid fats, added sugars, or sodium. Maintain a healthy weight Body mass index (BMI) is used to identify weight problems. It estimates body fat based on height and weight. Your health care provider can help determine your BMI and help you achieve or maintain a healthy weight. Get regular exercise Get regular exercise. This is one of the most important things you can do for your health. Most adults should: Exercise for at least 150 minutes each week. The exercise should increase your heart rate and make you sweat (moderate-intensity exercise). Do strengthening exercises at least twice a week. This is in addition to the moderate-intensity exercise. Spend less time sitting. Even light physical activity can be beneficial. Watch cholesterol and blood lipids Have your blood tested for lipids and cholesterol at 48 years of age, then have this test every 5 years. Have your cholesterol levels checked more often if: Your lipid or cholesterol levels are high. You are older than 48 years of age. You are at high risk for heart disease. What should I know about cancer screening? Depending on your health history and family history, you may need to have cancer  screening at various ages. This may include screening for: Breast cancer. Cervical cancer. Colorectal cancer. Skin cancer. Lung cancer. What should I know about heart disease, diabetes, and high blood pressure? Blood pressure and heart disease High blood pressure causes heart disease and increases the risk of stroke. This is more likely to develop in people who have high blood pressure readings or are overweight. Have your blood pressure checked: Every 3-5 years if you are 73-104 years of age. Every year if you are 66 years old or older. Diabetes Have regular diabetes screenings. This checks your fasting blood sugar level. Have the screening done: Once every three years after age 40 if you are at a normal weight and have a low risk for diabetes. More often and at a younger age if you are overweight or have a high risk for diabetes. What should I know about preventing infection? Hepatitis B If you have a higher risk for hepatitis B, you should be screened for this virus. Talk with your health care provider to find out if you are at risk for hepatitis B infection. Hepatitis C Testing is recommended for: Everyone born from [birth year] through 1964. Anyone with known risk factors for hepatitis C. Sexually transmitted infections (STIs) Get screened for STIs, including gonorrhea and chlamydia, if: You are sexually active and are younger than 47 years of age. You are older than 48 years of age and your health care provider tells you that you are at risk for this type of infection. Your sexual  activity has changed since you were last screened, and you are at increased risk for chlamydia or gonorrhea. Ask your health care provider if you are at risk. Ask your health care provider about whether you are at high risk for HIV. Your health care provider may recommend a prescription medicine to help prevent HIV infection. If you choose to take medicine to prevent HIV, you should first get tested for HIV. You  should then be tested every 3 months for as long as you are taking the medicine. Pregnancy If you are about to stop having your period (premenopausal) and you may become pregnant, seek counseling before you get pregnant. Take 400 to 800 micrograms (mcg) of folic acid every day if you become pregnant. Ask for birth control (contraception) if you want to prevent pregnancy. Osteoporosis and menopause Osteoporosis is a disease in which the bones lose minerals and strength with aging. This can result in bone fractures. If you are 7 years old or older, or if you are at risk for osteoporosis and fractures, ask your health care provider if you should: Be screened for bone loss. Take a calcium  or vitamin D  supplement to lower your risk of fractures. Be given hormone replacement therapy (HRT) to treat symptoms of menopause. Follow these instructions at home: Alcohol use Do not drink alcohol if: Your health care provider tells you not to drink. You are pregnant, may be pregnant, or are planning to become pregnant. If you drink alcohol: Limit how much you have to: 0-1 drink a day. Know how much alcohol is in your drink. In the [place] one drink equals one 12 oz bottle of beer (355 mL), one 5 oz glass of wine (148 mL), or one 1 oz glass of hard liquor (44 mL). Lifestyle Do not use any products that contain nicotine or tobacco. These products include cigarettes, chewing tobacco, and vaping devices, such as e-cigarettes. If you need help quitting, ask your health care provider. Do not use street drugs. Do not share needles. Ask your health care provider for help if you need support or information about quitting drugs. General instructions Schedule regular health, dental, and eye exams. Stay current with your vaccines. Tell your health care provider if: You often feel depressed. You have ever been abused or do not feel safe at home. Summary Adopting a healthy lifestyle and getting preventive care are  important in promoting health and wellness. Follow your health care provider's instructions about healthy diet, exercising, and getting tested or screened for diseases. Follow your health care provider's instructions on monitoring your cholesterol and blood pressure. This information is not intended to replace advice given to you by your health care provider. Make sure you discuss any questions you have with your health care provider. Document Revised: 08/13/2020 Document Reviewed: 08/13/2020 Elsevier Patient Education  2024 [employer]. "

## 2024-04-17 NOTE — Progress Notes (Unsigned)
 "   Subjective:    Patient ID: Debra Le, female    DOB: January 21, 1976, 49 y.o.   MRN: [id number]      HPI Debra Le is here for a Physical exam and her chronic medical problems.   Had an ear infection - just finished abx - still not feeling 100%.   Pill induced esophagitis from doxycycline-improved    Colonoscopy - [facility]    Medications and allergies reviewed with patient and updated if appropriate.  Medications Ordered Prior to Encounter[1]  Review of Systems  Constitutional:  Negative for fever.  Eyes:  Negative for visual disturbance.  Respiratory:  Negative for cough, shortness of breath and wheezing.   Cardiovascular:  Negative for chest pain, palpitations and leg swelling.  Gastrointestinal:  Positive for constipation. Negative for abdominal pain, blood in stool and diarrhea.       No gerd  Genitourinary:  Negative for dysuria.  Musculoskeletal:  Negative for arthralgias and back pain.  Skin:  Negative for rash.  Neurological:  Positive for headaches (occ). Negative for light-headedness.  Psychiatric/Behavioral:  Negative for dysphoric mood. The patient is not nervous/anxious.        Objective:   Vitals:   04/18/24 1317  BP: 128/80  Pulse: 85  Temp: 98.2 F (36.8 C)  SpO2: 99%   Filed Weights   04/18/24 1317  Weight: 231 lb (104.8 kg)   Body mass index is 33.15 kg/m.  BP Readings from Last 3 Encounters:  04/18/24 128/80  02/26/24 105/72  02/09/24 120/76    Wt Readings from Last 3 Encounters:  04/18/24 231 lb (104.8 kg)  02/26/24 221 lb (100.2 kg)  02/09/24 226 lb (102.5 kg)       Physical Exam Constitutional: She appears well-developed and well-nourished. No distress.  HENT:  Head: Normocephalic and atraumatic.  Right Ear: External ear normal. Normal ear canal and TM Left Ear: External ear normal.  Normal ear canal and TM Mouth/Throat: Oropharynx is clear and moist.  Eyes: Conjunctivae normal.  Neck: Neck supple. No tracheal  deviation present. No thyromegaly present.  No carotid bruit  Cardiovascular: Normal rate, regular rhythm and normal heart sounds.   No murmur heard.  No edema. Pulmonary/Chest: Effort normal and breath sounds normal. No respiratory distress. She has no wheezes. She has no rales.  Breast: deferred   Abdominal: Soft. She exhibits no distension. There is no tenderness.  Lymphadenopathy: She has no cervical adenopathy.  Skin: Skin is warm and dry. She is not diaphoretic.  Psychiatric: She has a normal mood and affect. Her behavior is normal.     Lab Results  Component Value Date   WBC 6.3 02/26/2024   HGB 11.6 (L) 02/26/2024   HCT 35.4 (L) 02/26/2024   PLT 260 02/26/2024   GLUCOSE 88 02/26/2024   CHOL 124 05/12/2023   TRIG 36.0 05/12/2023   HDL 45.20 05/12/2023   LDLCALC 71 05/12/2023   ALT 17 02/26/2024   AST 19 02/26/2024   NA 140 02/26/2024   K 3.7 02/26/2024   CL 101 02/26/2024   CREATININE 0.76 02/26/2024   BUN 9 02/26/2024   CO2 27 02/26/2024   TSH 1.09 12/17/2021   HGBA1C 5.4 05/12/2023   MICROALBUR 1.1 11/25/2019         Assessment & Plan:   Physical exam: Screening blood work  ordered Exercise  not as regularly- stressed getting back to regular exercise Weight  obese - gained a little weight Substance abuse  none  Reviewed recommended immunizations.   Health Maintenance  Topic Date Due   OPHTHALMOLOGY EXAM  04/10/2020   Diabetic kidney evaluation - Urine ACR  11/24/2020   FOOT EXAM  11/24/2020   Colonoscopy  Never done   Mammogram  01/16/2023   Influenza Vaccine  11/06/2023   HEMOGLOBIN A1C  11/09/2023   Pneumococcal Vaccine (2 of 2 - PCV) 05/11/2024   COVID-19 Vaccine (2 - Pfizer risk series) 05/03/2024 (Originally 03/14/2020)   Diabetic kidney evaluation - eGFR measurement  02/25/2025   DTaP/Tdap/Td (3 - Td or Tdap) 04/23/2028   Hepatitis B Vaccines 19-59 Average Risk  Completed   Hepatitis C Screening  Completed   HIV Screening  Completed    HPV VACCINES  Aged Out   Meningococcal B Vaccine  Aged Out     Will get last mammogram and colonoscopy  Eye exams up-to-date     See Problem List for Assessment and Plan of chronic medical problems.         [1]  Current Outpatient Medications on File Prior to Visit  Medication Sig Dispense Refill   amLODipine  (NORVASC ) 10 MG tablet Take 1 tablet (10 mg total) by mouth daily. 90 tablet 1   atorvastatin  (LIPITOR) 20 MG tablet Take 1 tablet (20 mg total) by mouth daily. 90 tablet 1   blood glucose meter kit and supplies KIT Dispense based on patient and insurance preference. Use up to four times daily as directed. 1 each 0   buPROPion  ER (WELLBUTRIN  SR) 100 MG 12 hr tablet TAKE 1 TABLET BY MOUTH TWICE A DAY 180 tablet 2   clindamycin (CLEOCIN T) 1 % lotion Apply 1 Application topically 2 (two) times daily as needed (irritation).     escitalopram  (LEXAPRO ) 10 MG tablet Take 1.5 tablets (15 mg total) by mouth daily. 135 tablet 1   metFORMIN  (GLUCOPHAGE ) 500 MG tablet TAKE 1 TABLET(500 MG) BY MOUTH TWICE DAILY WITH A MEAL 180 tablet 1   metoprolol  tartrate (LOPRESSOR ) 50 MG tablet Take 1 tablet (50 mg total) by mouth 2 (two) times daily. 180 tablet 1   Multiple Vitamin (MULTIVITAMIN WITH MINERALS) TABS tablet Take 1 tablet by mouth daily.     spironolactone (ALDACTONE) 25 MG tablet Take 25 mg by mouth.     tirzepatide  (MOUNJARO ) 15 MG/0.5ML Pen Inject 15 mg into the skin once a week. 2 mL 5   No current facility-administered medications on file prior to visit.   "

## 2024-04-18 ENCOUNTER — Telehealth: Payer: Self-pay | Admitting: Internal Medicine

## 2024-04-18 ENCOUNTER — Other Ambulatory Visit: Payer: Self-pay

## 2024-04-18 ENCOUNTER — Ambulatory Visit: Admitting: Internal Medicine

## 2024-04-18 VITALS — BP 128/80 | HR 85 | Temp 98.2°F | Ht 70.0 in | Wt 231.0 lb

## 2024-04-18 DIAGNOSIS — E1169 Type 2 diabetes mellitus with other specified complication: Secondary | ICD-10-CM

## 2024-04-18 DIAGNOSIS — E66811 Obesity, class 1: Secondary | ICD-10-CM | POA: Diagnosis not present

## 2024-04-18 DIAGNOSIS — I152 Hypertension secondary to endocrine disorders: Secondary | ICD-10-CM

## 2024-04-18 DIAGNOSIS — E785 Hyperlipidemia, unspecified: Secondary | ICD-10-CM

## 2024-04-18 DIAGNOSIS — E6609 Other obesity due to excess calories: Secondary | ICD-10-CM | POA: Diagnosis not present

## 2024-04-18 DIAGNOSIS — Z6833 Body mass index (BMI) 33.0-33.9, adult: Secondary | ICD-10-CM | POA: Diagnosis not present

## 2024-04-18 DIAGNOSIS — F3289 Other specified depressive episodes: Secondary | ICD-10-CM

## 2024-04-18 DIAGNOSIS — G43809 Other migraine, not intractable, without status migrainosus: Secondary | ICD-10-CM

## 2024-04-18 DIAGNOSIS — E1159 Type 2 diabetes mellitus with other circulatory complications: Secondary | ICD-10-CM

## 2024-04-18 DIAGNOSIS — K219 Gastro-esophageal reflux disease without esophagitis: Secondary | ICD-10-CM

## 2024-04-18 DIAGNOSIS — E559 Vitamin D deficiency, unspecified: Secondary | ICD-10-CM

## 2024-04-18 DIAGNOSIS — Z7985 Long-term (current) use of injectable non-insulin antidiabetic drugs: Secondary | ICD-10-CM | POA: Insufficient documentation

## 2024-04-18 DIAGNOSIS — F419 Anxiety disorder, unspecified: Secondary | ICD-10-CM

## 2024-04-18 LAB — MICROALBUMIN / CREATININE URINE RATIO
Creatinine,U: 197 mg/dL
Microalb Creat Ratio: 4.4 mg/g (ref 0.0–30.0)
Microalb, Ur: 0.9 mg/dL (ref 0.7–1.9)

## 2024-04-18 LAB — COMPREHENSIVE METABOLIC PANEL WITH GFR
ALT: 25 U/L (ref 3–35)
AST: 22 U/L (ref 5–37)
Albumin: 4 g/dL (ref 3.5–5.2)
Alkaline Phosphatase: 74 U/L (ref 39–117)
BUN: 15 mg/dL (ref 6–23)
CO2: 28 meq/L (ref 19–32)
Calcium: 8.8 mg/dL (ref 8.4–10.5)
Chloride: 104 meq/L (ref 96–112)
Creatinine, Ser: 1.02 mg/dL (ref 0.40–1.20)
GFR: 65.28 mL/min
Glucose, Bld: 85 mg/dL (ref 70–99)
Potassium: 4.3 meq/L (ref 3.5–5.1)
Sodium: 137 meq/L (ref 135–145)
Total Bilirubin: 0.4 mg/dL (ref 0.2–1.2)
Total Protein: 7.5 g/dL (ref 6.0–8.3)

## 2024-04-18 LAB — TSH: TSH: 1.2 u[IU]/mL (ref 0.35–5.50)

## 2024-04-18 LAB — LIPID PANEL
Cholesterol: 150 mg/dL (ref 28–200)
HDL: 49.8 mg/dL
LDL Cholesterol: 90 mg/dL (ref 10–99)
NonHDL: 100.66
Total CHOL/HDL Ratio: 3
Triglycerides: 54 mg/dL (ref 10.0–149.0)
VLDL: 10.8 mg/dL (ref 0.0–40.0)

## 2024-04-18 LAB — CBC
HCT: 32.9 % — ABNORMAL LOW (ref 36.0–46.0)
Hemoglobin: 11.1 g/dL — ABNORMAL LOW (ref 12.0–15.0)
MCHC: 33.7 g/dL (ref 30.0–36.0)
MCV: 90.1 fl (ref 78.0–100.0)
Platelets: 328 K/uL (ref 150.0–400.0)
RBC: 3.65 Mil/uL — ABNORMAL LOW (ref 3.87–5.11)
RDW: 15 % (ref 11.5–15.5)
WBC: 5.5 K/uL (ref 4.0–10.5)

## 2024-04-18 LAB — HEMOGLOBIN A1C: Hgb A1c MFr Bld: 5.4 % (ref 4.6–6.5)

## 2024-04-18 LAB — VITAMIN D 25 HYDROXY (VIT D DEFICIENCY, FRACTURES): VITD: 24.05 ng/mL — ABNORMAL LOW (ref 30.00–100.00)

## 2024-04-18 MED ORDER — AMLODIPINE BESYLATE 10 MG PO TABS: 10.0000 mg | ORAL_TABLET | Freq: Every day | ORAL | 1 refills | Status: AC

## 2024-04-18 MED ORDER — METOPROLOL TARTRATE 50 MG PO TABS: 50.0000 mg | ORAL_TABLET | Freq: Two times a day (BID) | ORAL | 1 refills | Status: AC

## 2024-04-18 MED ORDER — BUPROPION HCL ER (SR) 100 MG PO TB12: 100.0000 mg | ORAL_TABLET | Freq: Two times a day (BID) | ORAL | 2 refills | Status: AC

## 2024-04-18 MED ORDER — ESCITALOPRAM OXALATE 10 MG PO TABS: 15.0000 mg | ORAL_TABLET | Freq: Every day | ORAL | 1 refills | Status: DC

## 2024-04-18 MED ORDER — ATORVASTATIN CALCIUM 20 MG PO TABS: 20.0000 mg | ORAL_TABLET | Freq: Every day | ORAL | 1 refills | Status: DC

## 2024-04-18 MED ORDER — TIRZEPATIDE 15 MG/0.5ML ~~LOC~~ SOAJ: 15.0000 mg | SUBCUTANEOUS | 5 refills | Status: AC

## 2024-04-18 MED ORDER — AMLODIPINE BESYLATE 10 MG PO TABS: 10.0000 mg | ORAL_TABLET | Freq: Every day | ORAL | 1 refills | Status: DC

## 2024-04-18 MED ORDER — METFORMIN HCL 500 MG PO TABS: ORAL_TABLET | ORAL | 1 refills | Status: AC

## 2024-04-18 MED ORDER — BUPROPION HCL ER (SR) 100 MG PO TB12: 100.0000 mg | ORAL_TABLET | Freq: Two times a day (BID) | ORAL | 2 refills | Status: DC

## 2024-04-18 MED ORDER — ATORVASTATIN CALCIUM 20 MG PO TABS: 20.0000 mg | ORAL_TABLET | Freq: Every day | ORAL | 1 refills | Status: AC

## 2024-04-18 MED ORDER — METOPROLOL TARTRATE 50 MG PO TABS: 50.0000 mg | ORAL_TABLET | Freq: Two times a day (BID) | ORAL | 1 refills | Status: DC

## 2024-04-18 MED ORDER — ESCITALOPRAM OXALATE 10 MG PO TABS: 15.0000 mg | ORAL_TABLET | Freq: Every day | ORAL | 1 refills | Status: AC

## 2024-04-18 MED ORDER — METFORMIN HCL 500 MG PO TABS: ORAL_TABLET | ORAL | 1 refills | Status: DC

## 2024-04-18 NOTE — Assessment & Plan Note (Addendum)
 Chronic Blood pressure well controlled Continue metoprolol  50 mg twice daily, amlodipine  10 mg daily, also on spironolactone 25 mg daily for at bedtime Advised monitoring BP  CMP, CBC

## 2024-04-18 NOTE — Assessment & Plan Note (Addendum)
 Chronic Associated with hyperlipidemia, hypertension  Lab Results  Component Value Date   HGBA1C 5.4 05/12/2023   Sugars well controlled Check A1c, urine albumin/creatinine ratio Continue Mounjaro  15 mg weekly, metformin  500 mg twice daily Stressed regular exercise, diabetic diet-has gained a little weight and needs to get back to her regular exercise and diabetic diet Can consider discontinuing metformin  depending on A1c

## 2024-04-18 NOTE — Telephone Encounter (Signed)
 Copied from CRM #[id number]. Topic: Clinical - Prescription Issue >> Apr 18, 2024  2:42 PM Roselie BROCKS wrote: Reason for CRM: Patient states all the medication sent in at visit today 04-18-24 was to go to [id number] - [place], [place] - [address] Except for tirzepatide  15 MG/0.5ML Pen was to go to [facility], but everything was sent to [facility].  And needs to go to [facility]

## 2024-04-18 NOTE — Assessment & Plan Note (Signed)
Chronic Controlled, Stable Continue Wellbutrin 100 mg twice daily and Lexapro 15 mg daily

## 2024-04-18 NOTE — Assessment & Plan Note (Addendum)
 Chronic Controlled Takes aleve as needed

## 2024-04-18 NOTE — Assessment & Plan Note (Signed)
 Chronic BMI 33.15 with comorbidities of diabetes, hypertension, hyperlipidemia Currently taking Mounjaro  15 mg weekly Stressed regular exercise and increase protein intake, healthy diet

## 2024-04-18 NOTE — Assessment & Plan Note (Signed)
Chronic Regular exercise and healthy diet encouraged Check lipid panel, CMP Continue atorvastatin 20 mg daily 

## 2024-04-18 NOTE — Assessment & Plan Note (Signed)
Chronic GERD controlled Continue Nexium 20 mg daily 

## 2024-04-18 NOTE — Assessment & Plan Note (Signed)
 Chronic Taking vitamin D daily Check vitamin D level

## 2024-04-20 ENCOUNTER — Ambulatory Visit: Payer: Self-pay | Admitting: Internal Medicine

## 2024-10-17 ENCOUNTER — Ambulatory Visit: Admitting: Internal Medicine
